# Patient Record
Sex: Female | Born: 1985 | Race: White | Hispanic: No | Marital: Married | Smoking: Never smoker
Health system: Southern US, Community
[De-identification: ages and names within clinical notes are randomized; demographics above are authoritative.]

## PROBLEM LIST (undated history)

## (undated) ENCOUNTER — Inpatient Hospital Stay: Payer: Self-pay

## (undated) ENCOUNTER — Emergency Department: Admission: EM | Payer: BC Managed Care – PPO

## (undated) DIAGNOSIS — O24419 Gestational diabetes mellitus in pregnancy, unspecified control: Secondary | ICD-10-CM

## (undated) DIAGNOSIS — B019 Varicella without complication: Secondary | ICD-10-CM

## (undated) DIAGNOSIS — O26899 Other specified pregnancy related conditions, unspecified trimester: Secondary | ICD-10-CM

## (undated) DIAGNOSIS — Z6791 Unspecified blood type, Rh negative: Secondary | ICD-10-CM

## (undated) DIAGNOSIS — E079 Disorder of thyroid, unspecified: Secondary | ICD-10-CM

## (undated) HISTORY — PX: NO PAST SURGERIES: SHX2092

## (undated) HISTORY — DX: Other specified pregnancy related conditions, unspecified trimester: O26.899

## (undated) HISTORY — DX: Varicella without complication: B01.9

## (undated) HISTORY — DX: Unspecified blood type, rh negative: Z67.91

---
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
## 2017-03-01 DIAGNOSIS — O24419 Gestational diabetes mellitus in pregnancy, unspecified control: Secondary | ICD-10-CM

## 2018-05-15 ENCOUNTER — Encounter: Payer: Self-pay | Admitting: Medical

## 2018-05-15 ENCOUNTER — Ambulatory Visit: Payer: Self-pay | Admitting: Medical

## 2018-05-15 VITALS — BP 128/89 | HR 106 | Temp 98.1°F | Resp 16 | Wt 108.2 lb

## 2018-05-15 DIAGNOSIS — H6122 Impacted cerumen, left ear: Secondary | ICD-10-CM

## 2018-05-15 NOTE — Patient Instructions (Signed)
Earwax Buildup, Adult The ears produce a substance called earwax that helps keep bacteria out of the ear and protects the skin in the ear canal. Occasionally, earwax can build up in the ear and cause discomfort or hearing loss. What increases the risk? This condition is more likely to develop in people who:  Are female.  Are elderly.  Naturally produce more earwax.  Clean their ears often with cotton swabs.  Use earplugs often.  Use in-ear headphones often.  Wear hearing aids.  Have narrow ear canals.  Have earwax that is overly thick or sticky.  Have eczema.  Are dehydrated.  Have excess hair in the ear canal.  What are the signs or symptoms? Symptoms of this condition include:  Reduced or muffled hearing.  A feeling of fullness in the ear or feeling that the ear is plugged.  Fluid coming from the ear.  Ear pain.  Ear itch.  Ringing in the ear.  Coughing.  An obvious piece of earwax that can be seen inside the ear canal.  How is this diagnosed? This condition may be diagnosed based on:  Your symptoms.  Your medical history.  An ear exam. During the exam, your health care provider will look into your ear with an instrument called an otoscope.  You may have tests, including a hearing test. How is this treated? This condition may be treated by:  Using ear drops to soften the earwax.  Having the earwax removed by a health care provider. The health care provider may: ? Flush the ear with water. ? Use an instrument that has a loop on the end (curette). ? Use a suction device.  Surgery to remove the wax buildup. This may be done in severe cases.  Follow these instructions at home:  Take over-the-counter and prescription medicines only as told by your health care provider.  Do not put any objects, including cotton swabs, into your ear. You can clean the opening of your ear canal with a washcloth or facial tissue.  Follow instructions from your health  care provider about cleaning your ears. Do not over-clean your ears.  Drink enough fluid to keep your urine clear or pale yellow. This will help to thin the earwax.  Keep all follow-up visits as told by your health care provider. If earwax builds up in your ears often or if you use hearing aids, consider seeing your health care provider for routine, preventive ear cleanings. Ask your health care provider how often you should schedule your cleanings.  If you have hearing aids, clean them according to instructions from the manufacturer and your health care provider. Contact a health care provider if:  You have ear pain.  You develop a fever.  You have blood, pus, or other fluid coming from your ear.  You have hearing loss.  You have ringing in your ears that does not go away.  Your symptoms do not improve with treatment.  You feel like the room is spinning (vertigo). Summary  Earwax can build up in the ear and cause discomfort or hearing loss.  The most common symptoms of this condition include reduced or muffled hearing and a feeling of fullness in the ear or feeling that the ear is plugged.  This condition may be diagnosed based on your symptoms, your medical history, and an ear exam.  This condition may be treated by using ear drops to soften the earwax or by having the earwax removed by a health care provider.  Do   not put any objects, including cotton swabs, into your ear. You can clean the opening of your ear canal with a washcloth or facial tissue. This information is not intended to replace advice given to you by your health care provider. Make sure you discuss any questions you have with your health care provider. Document Released: 08/12/2004 Document Revised: 09/15/2016 Document Reviewed: 09/15/2016 Elsevier Interactive Patient Education  2018 [employer]vier Inc[employer]  

## 2018-05-15 NOTE — Progress Notes (Signed)
   Subjective:    Patient ID: Candice Hoover, female    DOB: Oct 09, 1985, 32 y.o.   MRN: [id number]  HPI 32 yo female in non acute distress. Returns to clinic for ear irrigation. Patient with  13 month old baby , not breast feeding.  Review of Systems "Decreased hearing left ear due to wax"    Objective:   Physical Exam  Constitutional: She appears well-developed and well-nourished.  HENT:  Head: Normocephalic and atraumatic.  Right Ear: Hearing normal. A middle ear effusion is present.  Left Ear: Hearing normal.  Nose: Mucosal edema (right side. swelling of turbinate noted.) present.  Eyes: Pupils are equal, round, and reactive to light. Conjunctivae and EOM are normal.  Neck: Normal range of motion. Neck supple.  Skin: Skin is warm and dry.  Psychiatric: She has a normal mood and affect. Her behavior is normal.  Nursing note and vitals reviewed.   Cerumen impaction left ear s/p irrigation , moved the wax so that I could use a curette to remove the wax.  Patient very grateful. Mild edema in the left nare, turbinate , no discharge, no odor. Denies headaches.  May be allergy induced.     Assessment & Plan:  Cerumen impaction left ear./allergies.  reexplained to patient irrigation of ear with water to remove the wax.   She verbalizes understanding prior to procedure. And has no qquestions. ETD in the right ear, recommmended OTC Claritin and Flonase nasal spray per package directions, she returns from [place] Nov.15 and she will make a follow up appointment when she returns for a recheck. She verbalizes understanding and has no questions at discharge.

## 2018-05-15 NOTE — Patient Instructions (Addendum)
Earwax Buildup, Adult The ears produce a substance called earwax that helps keep bacteria out of the ear and protects the skin in the ear canal. Occasionally, earwax can build up in the ear and cause discomfort or hearing loss. What increases the risk? This condition is more likely to develop in people who:  Are female.  Are elderly.  Naturally produce more earwax.  Clean their ears often with cotton swabs.  Use earplugs often.  Use in-ear headphones often.  Wear hearing aids.  Have narrow ear canals.  Have earwax that is overly thick or sticky.  Have eczema.  Are dehydrated.  Have excess hair in the ear canal.  What are the signs or symptoms? Symptoms of this condition include:  Reduced or muffled hearing.  A feeling of fullness in the ear or feeling that the ear is plugged.  Fluid coming from the ear.  Ear pain.  Ear itch.  Ringing in the ear.  Coughing.  An obvious piece of earwax that can be seen inside the ear canal.  How is this diagnosed? This condition may be diagnosed based on:  Your symptoms.  Your medical history.  An ear exam. During the exam, your health care provider will look into your ear with an instrument called an otoscope.  You may have tests, including a hearing test. How is this treated? This condition may be treated by:  Using ear drops to soften the earwax.  Having the earwax removed by a health care provider. The health care provider may: ? Flush the ear with water. ? Use an instrument that has a loop on the end (curette). ? Use a suction device.  Surgery to remove the wax buildup. This may be done in severe cases.  Follow these instructions at home:  Take over-the-counter and prescription medicines only as told by your health care provider.  Do not put any objects, including cotton swabs, into your ear. You can clean the opening of your ear canal with a washcloth or facial tissue.  Follow instructions from your health  care provider about cleaning your ears. Do not over-clean your ears.  Drink enough fluid to keep your urine clear or pale yellow. This will help to thin the earwax.  Keep all follow-up visits as told by your health care provider. If earwax builds up in your ears often or if you use hearing aids, consider seeing your health care provider for routine, preventive ear cleanings. Ask your health care provider how often you should schedule your cleanings.  If you have hearing aids, clean them according to instructions from the manufacturer and your health care provider. Contact a health care provider if:  You have ear pain.  You develop a fever.  You have blood, pus, or other fluid coming from your ear.  You have hearing loss.  You have ringing in your ears that does not go away.  Your symptoms do not improve with treatment.  You feel like the room is spinning (vertigo). Summary  Earwax can build up in the ear and cause discomfort or hearing loss.  The most common symptoms of this condition include reduced or muffled hearing and a feeling of fullness in the ear or feeling that the ear is plugged.  This condition may be diagnosed based on your symptoms, your medical history, and an ear exam.  This condition may be treated by using ear drops to soften the earwax or by having the earwax removed by a health care provider.  Do   not put any objects, including cotton swabs, into your ear. You can clean the opening of your ear canal with a washcloth or facial tissue. This information is not intended to replace advice given to you by your health care provider. Make sure you discuss any questions you have with your health care provider. Document Released: 08/12/2004 Document Revised: 09/15/2016 Document Reviewed: 09/15/2016 Elsevier Interactive Patient Education  2018 [employer]vier Inc.  

## 2018-05-15 NOTE — Progress Notes (Signed)
   Subjective:    Patient ID: Candice Hoover, female    DOB: 04/09/1986, 32 y.o.   MRN: [id number]  HPI 32 yo female in non acute distress. Usually gets wax in Left ear. Usually needs it flushed 1 time per year. Reduced hearing with the wax currently in the left ear. No pain..Wanted it checked and she also is  flying to [place] on Oct  31st and so she wanted to be  evaluated.     Review of Systems  Constitutional: Negative for diaphoresis and fever.  HENT: Positive for hearing loss (mild). Negative for congestion, ear discharge, ear pain and sore throat.   Respiratory: Negative for cough.   Cardiovascular: Negative for leg swelling.  Gastrointestinal: Negative for abdominal pain.  Allergic/Immunologic: Negative for environmental allergies and food allergies.  Neurological: Negative for dizziness, syncope and light-headedness.  Hematological: Negative for adenopathy.  Psychiatric/Behavioral: Negative for behavioral problems, self-injury and suicidal ideas.       Objective:   Physical Exam  Constitutional: She is oriented to person, place, and time. She appears well-developed and well-nourished.  HENT:  Head: Normocephalic and atraumatic.  Right Ear: External ear normal.  Left Ear: External ear normal.  Eyes: Pupils are equal, round, and reactive to light. Conjunctivae and EOM are normal.  Neck: Normal range of motion.  Neurological: She is oriented to person, place, and time.  Skin: Skin is warm and dry.  Psychiatric: She has a normal mood and affect. Her behavior is normal. Judgment and thought content normal.  Nursing note and vitals reviewed.    Ear irrigation     Assessment & Plan:  Cerumen impaction left ear Patient left prior to procedure, I think she did not understand that we were going to irrigate her ear so she left, before nurse came in, I was in seeing another patient.   She was called and is returning to the clinic at  2:30pm today.

## 2018-06-12 ENCOUNTER — Ambulatory Visit: Payer: Self-pay | Admitting: Medical

## 2018-06-12 ENCOUNTER — Encounter: Payer: Self-pay | Admitting: Medical

## 2018-06-12 VITALS — BP 124/76 | HR 115 | Temp 99.8°F | Resp 16 | Wt 109.6 lb

## 2018-06-12 DIAGNOSIS — H6983 Other specified disorders of Eustachian tube, bilateral: Secondary | ICD-10-CM

## 2018-06-12 DIAGNOSIS — J0110 Acute frontal sinusitis, unspecified: Secondary | ICD-10-CM

## 2018-06-12 MED ORDER — AMOXICILLIN 875 MG PO TABS: 875.0000 mg | ORAL_TABLET | Freq: Two times a day (BID) | ORAL | 0 refills | Status: DC

## 2018-06-12 NOTE — Patient Instructions (Signed)

## 2018-06-12 NOTE — Progress Notes (Signed)
   Subjective:    Patient ID: Candice MainlandKateryna Streb, female    DOB: 04/23/1986, 32 y.o.   MRN: [id number]  HPI 32 yo femlae in non acute distress. After being seen for wax impaction in the left ear 05/15/2018.Marland Kitchen.  She then got sore throat and swollen lymph nodes  X 2 days seemed to resolved on its own while in [place].  Then her son started on Friday with runny nose, treated with antibiotics.   Husband seen 4 days ago and diagnosed with  Bronchitis and is now being treated with antibiotics. Saturday patient had sore throat and post nasal drip and cough productive but clear. Fever  101.0 yesterday. Headache on forehead and temples and pain between eyes.   Denies shortness of breath or chest pain.   Blood pressure 124/76, pulse (!) 115, temperature 99.8 F (37.7 C), temperature source Tympanic, resp. rate 16, weight 109 lb 9.6 oz (49.7 kg), last menstrual period 05/22/2018, SpO2 98 %.  Review of Systems  Constitutional: Positive for fatigue and fever. Negative for chills.  HENT: Positive for congestion, postnasal drip, rhinorrhea, sinus pressure, sinus pain and sore throat. Negative for ear pain, sneezing and voice change.   Eyes: Negative for discharge and itching.  Respiratory: Positive for cough. Negative for chest tightness, shortness of breath and wheezing.   Cardiovascular: Negative for chest pain.  Gastrointestinal: Negative for abdominal pain.  Endocrine: Negative for polydipsia, polyphagia and polyuria.  Genitourinary: Negative for dysuria.  Musculoskeletal: Positive for myalgias.  Skin: Negative for rash.  Allergic/Immunologic: Negative for environmental allergies and food allergies.  Neurological: Positive for headaches (headache). Negative for dizziness, syncope and light-headedness.  Hematological: Negative for adenopathy.   Using condoms for birth control protection    Objective:   Physical Exam  HENT:  Head: Normocephalic and atraumatic.  Right Ear: Hearing, external ear and  ear canal normal. A middle ear effusion is present.  Left Ear: Hearing and ear canal normal. A middle ear effusion is present.  Nose: Mucosal edema (left) and rhinorrhea present. Right sinus exhibits no frontal sinus tenderness. Left sinus exhibits frontal sinus tenderness.  Mouth/Throat: Uvula is midline and mucous membranes are normal. Uvula swelling present. Posterior oropharyngeal edema and posterior oropharyngeal erythema present. Tonsils are 2+ on the right. Tonsils are 2+ on the left.          Assessment & Plan:  Sinusitis Frontal, ETD bilaterally Meds ordered this encounter  Medications  . amoxicillin (AMOXIL) 875 MG tablet    Sig: Take 1 tablet (875 mg total) by mouth 2 (two) times daily.    Dispense:  20 tablet    Refill:  0  Rest increase fluids, alternate OTC Motrin or Tylenol take per package instructions for headache or fever.  Return in  3-5 days if not improving . Patient verbalizes understanding and has no questions at discharge.

## 2018-12-24 LAB — FETAL NONSTRESS TEST

## 2018-12-31 LAB — FETAL NONSTRESS TEST

## 2019-02-17 ENCOUNTER — Other Ambulatory Visit: Payer: Self-pay

## 2019-02-17 ENCOUNTER — Emergency Department: Payer: BC Managed Care – PPO

## 2019-02-17 ENCOUNTER — Encounter: Payer: Self-pay | Admitting: Emergency Medicine

## 2019-02-17 ENCOUNTER — Emergency Department
Admission: EM | Admit: 2019-02-17 | Discharge: 2019-02-17 | Disposition: A | Discharge status: Home / Self Care | Payer: BC Managed Care – PPO | Attending: Emergency Medicine | Admitting: Emergency Medicine

## 2019-02-17 DIAGNOSIS — R202 Paresthesia of skin: Secondary | ICD-10-CM | POA: Diagnosis not present

## 2019-02-17 DIAGNOSIS — X58XXXA Exposure to other specified factors, initial encounter: Secondary | ICD-10-CM | POA: Diagnosis not present

## 2019-02-17 DIAGNOSIS — S299XXA Unspecified injury of thorax, initial encounter: Secondary | ICD-10-CM | POA: Diagnosis present

## 2019-02-17 DIAGNOSIS — Y929 Unspecified place or not applicable: Secondary | ICD-10-CM | POA: Diagnosis not present

## 2019-02-17 DIAGNOSIS — Y999 Unspecified external cause status: Secondary | ICD-10-CM | POA: Insufficient documentation

## 2019-02-17 DIAGNOSIS — S29019A Strain of muscle and tendon of unspecified wall of thorax, initial encounter: Secondary | ICD-10-CM | POA: Diagnosis not present

## 2019-02-17 DIAGNOSIS — R439 Unspecified disturbances of smell and taste: Secondary | ICD-10-CM | POA: Diagnosis not present

## 2019-02-17 DIAGNOSIS — Y939 Activity, unspecified: Secondary | ICD-10-CM | POA: Insufficient documentation

## 2019-02-17 LAB — COMPREHENSIVE METABOLIC PANEL
ALT: 16 U/L (ref 0–44)
AST: 16 U/L (ref 15–41)
Albumin: 4.6 g/dL (ref 3.5–5.0)
Alkaline Phosphatase: 45 U/L (ref 38–126)
Anion gap: 8 (ref 5–15)
BUN: 15 mg/dL (ref 6–20)
CO2: 25 mmol/L (ref 22–32)
Calcium: 9.2 mg/dL (ref 8.9–10.3)
Chloride: 102 mmol/L (ref 98–111)
Creatinine, Ser: 0.8 mg/dL (ref 0.44–1.00)
GFR calc Af Amer: >60 mL/min (ref >60)
GFR calc non Af Amer: >60 mL/min (ref >60)
Glucose, Bld: 149 mg/dL — ABNORMAL HIGH (ref 70–99)
Potassium: 3.1 mmol/L — ABNORMAL LOW (ref 3.5–5.1)
Sodium: 135 mmol/L (ref 135–145)
Total Bilirubin: 1.1 mg/dL (ref 0.3–1.2)
Total Protein: 7.7 g/dL (ref 6.5–8.1)

## 2019-02-17 LAB — CBC WITH DIFFERENTIAL/PLATELET
Abs Immature Granulocytes: 0.02 10*3/uL (ref 0.00–0.07)
Basophils Absolute: 0.1 10*3/uL (ref 0.0–0.1)
Basophils Relative: 1 %
Eosinophils Absolute: 0.3 10*3/uL (ref 0.0–0.5)
Eosinophils Relative: 2 %
HCT: 39.2 % (ref 36.0–46.0)
Hemoglobin: 13.6 g/dL (ref 12.0–15.0)
Immature Granulocytes: 0 %
Lymphocytes Relative: 50 %
Lymphs Abs: 5.3 10*3/uL — ABNORMAL HIGH (ref 0.7–4.0)
MCH: 29.4 pg (ref 26.0–34.0)
MCHC: 34.7 g/dL (ref 30.0–36.0)
MCV: 84.7 fL (ref 80.0–100.0)
Monocytes Absolute: 0.9 10*3/uL (ref 0.1–1.0)
Monocytes Relative: 9 %
Neutro Abs: 4 10*3/uL (ref 1.7–7.7)
Neutrophils Relative %: 38 %
Platelets: 211 10*3/uL (ref 150–400)
RBC: 4.63 MIL/uL (ref 3.87–5.11)
RDW: 13.1 % (ref 11.5–15.5)
WBC: 10.6 10*3/uL — ABNORMAL HIGH (ref 4.0–10.5)
nRBC: 0 % (ref 0.0–0.2)

## 2019-02-17 MED ORDER — DIAZEPAM 5 MG PO TABS
5.0000 mg | ORAL_TABLET | Freq: Once | ORAL | Status: AC
  Administered 2019-02-17: 5 mg via ORAL
  Filled 2019-02-17: qty 1

## 2019-02-17 MED ORDER — IBUPROFEN 600 MG PO TABS: 600.0000 mg | ORAL_TABLET | Freq: Three times a day (TID) | ORAL | 0 refills | Status: DC | PRN

## 2019-02-17 MED ORDER — DIAZEPAM 5 MG PO TABS: 5.0000 mg | ORAL_TABLET | Freq: Three times a day (TID) | ORAL | 0 refills | Status: DC | PRN

## 2019-02-17 NOTE — ED Provider Notes (Signed)
[facility] Emergency Department Provider Note       Time seen: ----------------------------------------- 4:38 AM on 02/17/2019 -----------------------------------------   I have reviewed the triage vital signs and the nursing notes.  HISTORY   Chief Complaint Back Pain    HPI Candice Hoover is a 33 y.o. female with no specific past medical history who presents to the ED for left upper back pain that was intermittent.  She felt hot and tingling.  She then began having numbness in her arms and legs and feeling like she could not taste anything in her mouth.  She denies any history of this before.  Past Medical History:  Diagnosis Date  . Varicella     There are no active problems to display for this patient.   History reviewed. No pertinent surgical history.  Allergies Patient has no known allergies.  Social History Social History   Tobacco Use  . Smoking status: Never Smoker  . Smokeless tobacco: Never Used  Substance Use Topics  . Alcohol use: Not Currently    Frequency: Never  . Drug use: Never   Review of Systems Constitutional: Negative for fever. Cardiovascular: Negative for chest pain. Respiratory: Negative for shortness of breath. Gastrointestinal: Negative for abdominal pain, vomiting and diarrhea. Musculoskeletal: Positive for back pain Skin: Negative for rash. Neurological: Negative for headaches, positive for paresthesias  All systems negative/normal/unremarkable except as stated in the HPI  ____________________________________________   PHYSICAL EXAM:  VITAL SIGNS: ED Triage Vitals  Enc Vitals Group     BP 02/17/19 0110 123/81     Pulse Rate 02/17/19 0110 97     Resp 02/17/19 0110 18     Temp 02/17/19 0110 98.3 F (36.8 C)     Temp Source 02/17/19 0110 Oral     SpO2 02/17/19 0110 100 %     Weight 02/17/19 0111 105 lb (47.6 kg)     Height 02/17/19 0111 5\' 4"  (1.626 m)     Head Circumference --      Peak  Flow --      Pain Score 02/17/19 0111 4     Pain Loc --      Pain Edu? --      Excl. in [place]? --    Constitutional: Alert and oriented. Well appearing and in no distress. Eyes: Conjunctivae are normal. Normal extraocular movements. Cardiovascular: Normal rate, regular rhythm. No murmurs, rubs, or gallops. Respiratory: Normal respiratory effort without tachypnea nor retractions. Breath sounds are clear and equal bilaterally. No wheezes/rales/rhonchi. Gastrointestinal: Soft and nontender. Normal bowel sounds Musculoskeletal: Nontender with normal range of motion in extremities. No lower extremity tenderness nor edema. Neurologic:  Normal speech and language. No gross focal neurologic deficits are appreciated.  Skin:  Skin is warm, dry and intact. No rash noted. Psychiatric: Mood and affect are normal. Speech and behavior are normal.  ____________________________________________  ED COURSE:  As part of my medical decision making, I reviewed the following data within the [place] History obtained from family if available, nursing notes, old chart and ekg, as well as notes from prior ED visits. Patient presented for back pain and paresthesias, we will assess with labs and imaging as indicated at this time.   Procedures  Candice Hoover was evaluated in Emergency Department on 02/17/2019 for the symptoms described in the history of present illness. She was evaluated in the context of the global COVID-19 pandemic, which necessitated consideration that the patient might be at risk for infection with  the SARS-CoV-2 virus that causes COVID-19. Institutional protocols and algorithms that pertain to the evaluation of patients at risk for COVID-19 are in a state of rapid change based on information released by regulatory bodies including the CDC and federal and state organizations. These policies and algorithms were followed during the patient's care in the ED.   ____________________________________________   LABS (pertinent positives/negatives)  Labs Reviewed  CBC WITH DIFFERENTIAL/PLATELET - Abnormal; Notable for the following components:      Result Value   WBC 10.6 (*)    Lymphs Abs 5.3 (*)    All other components within normal limits  COMPREHENSIVE METABOLIC PANEL - Abnormal; Notable for the following components:   Potassium 3.1 (*)    Glucose, Bld 149 (*)    All other components within normal limits    RADIOLOGY  Thoracic spine x-rays were unremarkable  ____________________________________________   DIFFERENTIAL DIAGNOSIS   Muscle spasm, strain, paresthesia, panic attack  FINAL ASSESSMENT AND PLAN  Muscle spasm, paresthesia   Plan: The patient had presented for muscle spasms in her left upper back with resulting panic attack when she was worried this may be more serious. Patient's labs were reassuring. Patient's imaging did not reveal any acute process.  I will encourage anti-inflammatory and muscle relaxants.  She is cleared for outpatient follow-up.   Ulice DashJohnathan E Kendricks Reap, MD    Note: This note was generated in part or whole with voice recognition software. Voice recognition is usually quite accurate but there are transcription errors that can and very often do occur. I apologize for any typographical errors that were not detected and corrected.     Emily FilbertWilliams, Zander Ingham E, MD 02/17/19 [phone]

## 2019-02-17 NOTE — ED Triage Notes (Addendum)
Patient states that she was moving yesterday. Patient states that she developed left upper back pain that was intermittent and felt hot and tingling. Patient states that tonight she started feeling tingling everywhere.

## 2019-02-17 NOTE — ED Notes (Signed)
No peripheral IV placed this visit.   Discharge instructions reviewed with patient. Questions fielded by this RN. Patient verbalizes understanding of instructions. Patient discharged home in stable condition per williams. No acute distress noted at time of discharge.   

## 2019-03-30 ENCOUNTER — Other Ambulatory Visit: Payer: Self-pay

## 2019-03-30 ENCOUNTER — Ambulatory Visit: Payer: Self-pay | Admitting: Nurse Practitioner

## 2019-03-30 ENCOUNTER — Encounter: Payer: Self-pay | Admitting: Nurse Practitioner

## 2019-03-30 VITALS — BP 108/79 | HR 85 | Temp 98.2°F | Resp 16 | Ht 64.0 in | Wt 104.0 lb

## 2019-03-30 DIAGNOSIS — R0982 Postnasal drip: Secondary | ICD-10-CM

## 2019-03-30 DIAGNOSIS — H6123 Impacted cerumen, bilateral: Secondary | ICD-10-CM

## 2019-03-30 NOTE — Patient Instructions (Signed)
Consider Allegra to help drainage Fluids and rest encouraged Encouraged patient to call the office or primary care doctor for an appointment if no improvement in symptoms or if symptoms change or worsen after 72 hours of planned treatment. Patient verbalized understanding of all instructions given/reviewed and has no further questions or concerns at this time.

## 2019-03-30 NOTE — Progress Notes (Signed)
   Subjective:    Patient ID: Candice Hoover, female    DOB: Jun 11, 1986, 33 y.o.   MRN: [id number]  HPI Lateefah is here today with c/o PND and sore throat x 2 weeks. She reports she continues to get sick when her son is in daycare and they have just returned. She denies anyone currently in the home with similar symptoms. She reports her throat discomfort waxes and wanes. She hasnt taken any medicatioin but has used hot tea with honey and lemon. She denies any fever, wheezing, SOB, facial pressure or pain, ear pain.    Review of Systems  Constitutional: Negative for chills and fever.  HENT: Positive for sore throat. Negative for congestion, sinus pressure and sinus pain.   Respiratory: Negative for cough, shortness of breath and wheezing.   Cardiovascular: Negative for chest pain.  Skin: Negative for color change and rash.       Objective:   Physical Exam Vitals signs reviewed.  Constitutional:      Appearance: She is well-developed. She is not ill-appearing.  HENT:     Head: Normocephalic and atraumatic.     Comments: No maxillary or frontal sinus tenderness    Nose: No congestion.     Mouth/Throat:     Mouth: Mucous membranes are moist.     Pharynx: No pharyngeal swelling or oropharyngeal exudate.     Tonsils: No tonsillar exudate.     Comments: Mildly injected pharnyx with cobblestone. Unable to visualize TM d/t bilateral cerumen impaction. Some success with ear lavage/currette but still difficult to examine TM. Neck:     Musculoskeletal: Normal range of motion.  Cardiovascular:     Rate and Rhythm: Normal rate and regular rhythm.     Heart sounds: Normal heart sounds.  Pulmonary:     Effort: Pulmonary effort is normal. No respiratory distress.     Breath sounds: Normal breath sounds.  Abdominal:     General: Bowel sounds are normal.     Palpations: Abdomen is soft.  Musculoskeletal: Normal range of motion.  Skin:    General: Skin is warm and dry.  Neurological:   Mental Status: She is alert and oriented to person, place, and time.  Psychiatric:        Mood and Affect: Mood normal.           Assessment & Plan:

## 2019-06-19 ENCOUNTER — Ambulatory Visit (INDEPENDENT_AMBULATORY_CARE_PROVIDER_SITE_OTHER): Payer: BC Managed Care – PPO | Admitting: Obstetrics and Gynecology

## 2019-06-19 ENCOUNTER — Encounter: Payer: Self-pay | Admitting: Obstetrics and Gynecology

## 2019-06-19 ENCOUNTER — Other Ambulatory Visit: Payer: Self-pay

## 2019-06-19 VITALS — BP 106/68 | Wt 112.0 lb

## 2019-06-19 DIAGNOSIS — O09291 Supervision of pregnancy with other poor reproductive or obstetric history, first trimester: Secondary | ICD-10-CM

## 2019-06-19 DIAGNOSIS — Z6791 Unspecified blood type, Rh negative: Secondary | ICD-10-CM | POA: Insufficient documentation

## 2019-06-19 DIAGNOSIS — Z124 Encounter for screening for malignant neoplasm of cervix: Secondary | ICD-10-CM

## 2019-06-19 DIAGNOSIS — Z8632 Personal history of gestational diabetes: Secondary | ICD-10-CM

## 2019-06-19 DIAGNOSIS — O0990 Supervision of high risk pregnancy, unspecified, unspecified trimester: Secondary | ICD-10-CM | POA: Insufficient documentation

## 2019-06-19 DIAGNOSIS — Z3A10 10 weeks gestation of pregnancy: Secondary | ICD-10-CM

## 2019-06-19 DIAGNOSIS — O26891 Other specified pregnancy related conditions, first trimester: Secondary | ICD-10-CM

## 2019-06-19 DIAGNOSIS — Z113 Encounter for screening for infections with a predominantly sexual mode of transmission: Secondary | ICD-10-CM

## 2019-06-19 DIAGNOSIS — O0991 Supervision of high risk pregnancy, unspecified, first trimester: Secondary | ICD-10-CM

## 2019-06-19 DIAGNOSIS — Z3201 Encounter for pregnancy test, result positive: Secondary | ICD-10-CM | POA: Diagnosis not present

## 2019-06-19 DIAGNOSIS — Z1379 Encounter for other screening for genetic and chromosomal anomalies: Secondary | ICD-10-CM

## 2019-06-19 LAB — POCT URINE PREGNANCY: Preg Test, Ur: POSITIVE — AB

## 2019-06-19 NOTE — Progress Notes (Signed)
New Obstetric Patient H&P   Chief Complaint: "Desires prenatal care"   History of Present Illness: Patient is a 33 y.o. G3P1011 Not Hispanic or Latino female, LMP 04/06/2019 presents with amenorrhea and positive home pregnancy test. Based on her  LMP, her EDD is Estimated Date of Delivery: 01/11/20 and her EGA is [redacted]w[redacted]d. Cycles are somewhat irregular.  Her cycles can be between 28-36 days. Her last pap smear was nearly 2 years ago and was no abnormalities.    She had a urine pregnancy test which was positive 6 or 7 week(s)  ago. Her last menstrual period was normal and lasted for  4 day(s). Since her LMP she claims she has experienced no issues. She denies vaginal bleeding. Her past medical history is noncontributory. Her prior pregnancies are notable for gestational diabetes (diet controlled).  Her first baby weight 7 lb 12 oz.   Since her LMP, she admits to the use of tobacco products  no She claims she has lost 3 pounds since the start of her pregnancy.  There are cats in the home in the home  no  She admits close contact with children on a regular basis  yes  She has had chicken pox in the past yes She has had Tuberculosis exposures, symptoms, or previously tested positive for TB   no Current or past history of domestic violence. no  Genetic Screening/Teratology Counseling: (Includes patient, baby's father, or anyone in either family with:)   67. Patient's age >/= 44 [facility]  no 2. Thalassemia ([place], [place], [place], or Asian background): MCV<80  no 3. Neural tube defect (meningomyelocele, spina bifida, anencephaly)  no 4. Congenital heart defect  no  5. Down syndrome  no 6. Tay-Sachs (Jewish, [place])  no 7. Canavan's Disease  no 8. Sickle cell disease or trait (African)  no  9. Hemophilia or other blood disorders  no  10. Muscular dystrophy  no  11. Cystic fibrosis  no  12. Huntington's Chorea  no  13. Mental retardation/autism  no 14. Other inherited genetic or  chromosomal disorder  no 15. Maternal metabolic disorder (DM, PKU, etc)  no 16. Patient or FOB with a child with a birth defect not listed above no  16a. Patient or FOB with a birth defect themselves no 17. Recurrent pregnancy loss, or stillbirth  no  18. Any medications since LMP other than prenatal vitamins (include vitamins, supplements, OTC meds, drugs, alcohol)  None.  19. Any other genetic/environmental exposure to discuss  no  Infection History:   1. Lives with someone with TB or TB exposed  no  2. Patient or partner has history of genital herpes  no 3. Rash or viral illness since LMP  no 4. History of STI (GC, CT, HPV, syphilis, HIV)  no 5. History of recent travel :  no  Other pertinent information:  nausea  Review of Systems:10 point review of systems negative unless otherwise noted in HPI  Past Medical History:  Diagnosis Date  . Gestational diabetes   . Rh negative state in antepartum period   . Varicella    Past Surgical History:  Procedure Laterality Date  . NO PAST SURGERIES     Gynecologic History: Patient's last menstrual period was 04/06/2019.  Obstetric History: G3P1011, s/p SVD  Family History  Problem Relation Age of Onset  . Thyroid disease Sister     Social History   Socioeconomic History  . Marital status: Married    Spouse name: Not on file  .  Number of children: Not on file  . Years of education: Not on file  . Highest education level: Not on file  Occupational History  . Not on file  Social Needs  . Financial resource strain: Not on file  . Food insecurity    Worry: Not on file    Inability: Not on file  . Transportation needs    Medical: Not on file    Non-medical: Not on file  Tobacco Use  . Smoking status: Never Smoker  . Smokeless tobacco: Never Used  Substance and Sexual Activity  . Alcohol use: Not Currently    Frequency: Never  . Drug use: Never  . Sexual activity: Yes    Birth control/protection: None  Lifestyle  .  Physical activity    Days per week: Not on file    Minutes per session: Not on file  . Stress: Not on file  Relationships  . Social Musician on phone: Not on file    Gets together: Not on file    Attends religious service: Not on file    Active member of club or organization: Not on file    Attends meetings of clubs or organizations: Not on file    Relationship status: Not on file  . Intimate partner violence    Fear of current or ex partner: Not on file    Emotionally abused: Not on file    Physically abused: Not on file    Forced sexual activity: Not on file  Other Topics Concern  . Not on file  Social History Narrative  . Not on file   Allergies: No Known Allergies  Prior to Admission medications   Medication Sig Start Date End Date Taking? Authorizing Provider  Prenatal Vit-Fe Fumarate-FA (PRENATAL VITAMIN PO) Take by mouth.   Yes [provider]   Physical Exam BP 106/68   Wt 112 lb (50.8 kg)   LMP 04/06/2019   BMI 19.22 kg/m   Physical Exam Vitals signs reviewed. Exam conducted with a chaperone present.  Constitutional:      General: She is not in acute distress.    Appearance: Normal appearance.  HENT:     Head: Normocephalic and atraumatic.  Eyes:     General: No scleral icterus.    Conjunctiva/sclera: Conjunctivae normal.  Cardiovascular:     Rate and Rhythm: Normal rate and regular rhythm.     Heart sounds: No murmur. No friction rub. No gallop.   Pulmonary:     Effort: No respiratory distress.     Breath sounds: Normal breath sounds. No wheezing, rhonchi or rales.  Abdominal:     General: Bowel sounds are normal. There is no distension.     Palpations: Abdomen is soft. There is no mass.     Tenderness: There is no abdominal tenderness. There is no guarding or rebound.     Hernia: There is no hernia in the left inguinal area or right inguinal area.  Genitourinary:    General: Normal vulva.     Exam position: Lithotomy position.      Tanner stage (genital): 5.     Labia:        Right: No rash, tenderness, lesion or injury.        Left: No rash, tenderness, lesion or injury.      Vagina: Normal.     Cervix: Normal.     Uterus: Normal.      Adnexa: Right adnexa normal and left adnexa  normal.  Musculoskeletal: Normal range of motion.        General: No swelling.  Lymphadenopathy:     Lower Body: No right inguinal adenopathy. No left inguinal adenopathy.  Skin:    General: Skin is warm and dry.  Neurological:     General: No focal deficit present.     Mental Status: She is oriented to person, place, and time.     Cranial Nerves: No cranial nerve deficit.  Psychiatric:        Mood and Affect: Mood normal.        Behavior: Behavior normal.        Judgment: Judgment normal.    Female Chaperone present during breast and/or pelvic exam.  Assessment: 33 y.o. G3P1011 at 957w4d presenting to initiate prenatal care  Plan: 1) Avoid alcoholic beverages. 2) Patient encouraged not to smoke.  3) Discontinue the use of all non-medicinal drugs and chemicals.  4) Take prenatal vitamins daily.  5) Nutrition, food safety (fish, cheese advisories, and high nitrite foods) and exercise discussed. 6) Hospital and practice style discussed with cross coverage system.  7) Genetic Screening, such as with 1st Trimester Screening, cell free fetal DNA, AFP testing, and Ultrasound, as well as with amniocentesis and CVS as appropriate, is discussed with patient. At the conclusion of today's visit patient requested genetic testing 8) Patient is asked about travel to areas at risk for the Zika virus, and counseled to avoid travel and exposure to mosquitoes or sexual partners who may have themselves been exposed to the virus. Testing is discussed, and will be ordered as appropriate.  9) Flu vaccine today 10) Carrier screening today  Thomasene MohairStephen Faruq Rosenberger, MD 06/19/2019 10:55 AM

## 2019-06-19 NOTE — Addendum Note (Signed)
Addended by: Brien Few on: 06/19/2019 03:19 PM   Modules accepted: Orders

## 2019-06-20 LAB — URINE DRUG PANEL 7
Amphetamines, Urine: NEGATIVE ng/mL
Barbiturate Quant, Ur: NEGATIVE ng/mL
Benzodiazepine Quant, Ur: NEGATIVE ng/mL
Cannabinoid Quant, Ur: NEGATIVE ng/mL
Cocaine (Metab.): NEGATIVE ng/mL
Opiate Quant, Ur: NEGATIVE ng/mL
PCP Quant, Ur: NEGATIVE ng/mL

## 2019-06-21 LAB — HEMOGLOBINOPATHY EVALUATION
HGB C: 0 %
HGB S: 0 %
HGB VARIANT: 0 %
Hemoglobin A2 Quantitation: 2 % (ref 1.8–3.2)
Hemoglobin F Quantitation: 0 % (ref 0.0–2.0)
Hgb A: 98 % (ref 96.4–98.8)

## 2019-06-21 LAB — CYTOLOGY - PAP
Chlamydia: NEGATIVE
Comment: NEGATIVE
Comment: NEGATIVE
Comment: NORMAL
Diagnosis: NEGATIVE
High risk HPV: NEGATIVE
Neisseria Gonorrhea: NEGATIVE

## 2019-06-21 LAB — RPR+RH+ABO+RUB AB+AB SCR+CB...
HIV Screen 4th Generation wRfx: NONREACTIVE
Hematocrit: 41.9 % (ref 34.0–46.6)
Hemoglobin: 14.2 g/dL (ref 11.1–15.9)
Hepatitis B Surface Ag: NEGATIVE
MCH: 29.5 pg (ref 26.6–33.0)
MCHC: 33.9 g/dL (ref 31.5–35.7)
MCV: 87 fL (ref 79–97)
Platelets: 297 10*3/uL (ref 150–450)
RBC: 4.82 x10E6/uL (ref 3.77–5.28)
RDW: 13.2 % (ref 11.7–15.4)
RPR Ser Ql: NONREACTIVE
Rh Factor: NEGATIVE
Rubella Antibodies, IGG: 8.78 index (ref >0.99)
Varicella zoster IgG: 1192 index (ref >165)
WBC: 17.2 10*3/uL — ABNORMAL HIGH (ref 3.4–10.8)

## 2019-06-21 LAB — URINE CULTURE: Organism ID, Bacteria: NO GROWTH

## 2019-06-21 LAB — AB SCR+ANTIBODY ID: Antibody Screen: POSITIVE — AB

## 2019-06-24 LAB — MATERNIT21 PLUS CORE+SCA
Fetal Fraction: 7
Monosomy X (Turner Syndrome): NOT DETECTED
Result (T21): NEGATIVE
Trisomy 13 (Patau syndrome): NEGATIVE
Trisomy 18 (Edwards syndrome): NEGATIVE
Trisomy 21 (Down syndrome): NEGATIVE
XXX (Triple X Syndrome): NOT DETECTED
XXY (Klinefelter Syndrome): NOT DETECTED
XYY (Jacobs Syndrome): NOT DETECTED

## 2019-06-29 LAB — INHERITEST CORE(CF97,SMA,FRAX)

## 2019-07-05 ENCOUNTER — Other Ambulatory Visit: Payer: BC Managed Care – PPO

## 2019-07-05 ENCOUNTER — Encounter: Payer: Self-pay | Admitting: Obstetrics and Gynecology

## 2019-07-05 ENCOUNTER — Ambulatory Visit (INDEPENDENT_AMBULATORY_CARE_PROVIDER_SITE_OTHER): Payer: BC Managed Care – PPO | Admitting: Obstetrics and Gynecology

## 2019-07-05 ENCOUNTER — Other Ambulatory Visit: Payer: Self-pay

## 2019-07-05 VITALS — BP 112/70 | Wt 111.0 lb

## 2019-07-05 DIAGNOSIS — Z3A12 12 weeks gestation of pregnancy: Secondary | ICD-10-CM

## 2019-07-05 DIAGNOSIS — O26891 Other specified pregnancy related conditions, first trimester: Secondary | ICD-10-CM

## 2019-07-05 DIAGNOSIS — Z8632 Personal history of gestational diabetes: Secondary | ICD-10-CM

## 2019-07-05 DIAGNOSIS — O09291 Supervision of pregnancy with other poor reproductive or obstetric history, first trimester: Secondary | ICD-10-CM

## 2019-07-05 DIAGNOSIS — O0990 Supervision of high risk pregnancy, unspecified, unspecified trimester: Secondary | ICD-10-CM

## 2019-07-05 DIAGNOSIS — Z6791 Unspecified blood type, Rh negative: Secondary | ICD-10-CM

## 2019-07-05 NOTE — Progress Notes (Signed)
Routine Prenatal Care Visit  Subjective  Candice Hoover is a 33 y.o. G3P1011 at [redacted]w[redacted]d being seen today for ongoing prenatal care.  She is currently monitored for the following issues for this low-risk pregnancy and has Supervision of high risk pregnancy, antepartum; History of gestational diabetes; and Rh negative state in antepartum period on their problem list.  ----------------------------------------------------------------------------------- Patient reports no complaints.    . Vag. Bleeding: None.   . Leaking Fluid denies.  ----------------------------------------------------------------------------------- The following portions of the patient's history were reviewed and updated as appropriate: allergies, current medications, past family history, past medical history, past social history, past surgical history and problem list. Problem list updated.  Objective  Blood pressure 112/70, weight 111 lb (50.3 kg), last menstrual period 04/06/2019. Pregravid weight 115 lb (52.2 kg) Total Weight Gain -4 lb (-1.814 kg) Urinalysis: Urine Protein    Urine Glucose    Fetal Status: Fetal Heart Rate (bpm): 150         General:  Alert, oriented and cooperative. Patient is in no acute distress.  Skin: Skin is warm and dry. No rash noted.   Cardiovascular: Normal heart rate noted  Respiratory: Normal respiratory effort, no problems with respiration noted  Abdomen: Soft, gravid, appropriate for gestational age.       Pelvic:  Cervical exam deferred        Extremities: Normal range of motion.     Mental Status: Normal mood and affect. Normal behavior. Normal judgment and thought content.   Assessment   33 y.o. G3P1011 at [redacted]w[redacted]d by  01/11/2020, by Last Menstrual Period presenting for routine prenatal visit  Plan   pregnancy Problems (from 06/19/19 to present)    Problem Noted Resolved   Supervision of high risk pregnancy, antepartum 06/19/2019 by Will Bonnet, MD No   Overview Signed  06/19/2019 10:57 AM by Will Bonnet, MD    [facility]inic [facility] Prenatal Labs  Dating  Blood type:     Genetic Screen 1 Screen:    AFP:     Quad:     NIPS: Antibody:   Anatomic [place]  Rubella:   Varicella: @VZVIGG @  GTT Early:               Third trimester:  RPR:     Rhogam  HBsAg:     TDaP vaccine                       Flu Shot: HIV:     Baby Food                                GBS:   Contraception  Pap:  CBB     CS/VBAC    Support Person            History of gestational diabetes 06/19/2019 by Will Bonnet, MD No   Overview Signed 06/19/2019 10:57 AM by Will Bonnet, MD    [ ]  early 1h gtt      Rh negative state in antepartum period 06/19/2019 by Will Bonnet, MD No   Overview Signed 06/19/2019 10:58 AM by Will Bonnet, MD    [ ]  rhogam per protocol          Preterm labor symptoms and general obstetric precautions including but not limited to vaginal bleeding, contractions, leaking of fluid and fetal movement were reviewed in detail with the patient. Please refer to  After Visit Summary for other counseling recommendations.   - 1h gtt today  Return in about 4 weeks (around 08/02/2019) for Routine Prenatal Appointment.  Thomasene Mohair, MD, [facility], [facility] 07/05/2019 3:01 PM

## 2019-07-06 LAB — GLUCOSE, 1 HOUR GESTATIONAL: Gestational Diabetes Screen: 112 mg/dL (ref 65–139)

## 2019-07-20 NOTE — L&D Delivery Note (Signed)
Obstetrical Delivery Note   Date of Delivery:   01/07/2020 Primary OB:   [facility] Gestational Age/EDD: [redacted]w[redacted]d (Dated by LMP) Antepartum complications: concerns for possible intrahepatic cholestasis  Delivered By:   Farrel Conners, CNM  Delivery Type:   spontaneous vaginal delivery  Procedure Details:   CTSP with urge to push and rapidly progressing labor. Mother pushed to deliver a viable female infant in LOA. Nuchal cord x 1 reduced over shoulder with delivery. Arm cord reduced after birth. Meconium stained amniotic fluid noted after delivery of baby. Baby was initially with poor tone and not crying. She was placed on mother's abdomen and began crying with the tactile stimulation. After a delayed cord clamping the cord was cut by the father of the baby. Baby then placed skin to skin with mother. Spontaneous delivery of intact placenta and 3 vessel cord. Pitocin IV and fundal massage resulted in firm fundus. Small periurethral abrasion was not bleeding and wound edges approximated well-repair not indicated. Minimal blood loss with delivery. Anesthesia:    none Intrapartum complications: MSAF, nuchal and arm cords GBS:    negative Laceration:    Periurethral, not needing repair Episiotomy:    none Placenta:    Via active 3rd stage. To pathology: no Estimated Blood Loss:  100 ml Baby:    Liveborn female, Apgars 7/8, weight pending    Farrel Conners, CNM

## 2019-08-02 ENCOUNTER — Other Ambulatory Visit: Payer: Self-pay

## 2019-08-02 ENCOUNTER — Ambulatory Visit (INDEPENDENT_AMBULATORY_CARE_PROVIDER_SITE_OTHER): Payer: BC Managed Care – PPO | Admitting: Obstetrics and Gynecology

## 2019-08-02 ENCOUNTER — Encounter: Payer: Self-pay | Admitting: Obstetrics and Gynecology

## 2019-08-02 VITALS — BP 110/64 | Wt 113.0 lb

## 2019-08-02 DIAGNOSIS — Z6791 Unspecified blood type, Rh negative: Secondary | ICD-10-CM

## 2019-08-02 DIAGNOSIS — O09292 Supervision of pregnancy with other poor reproductive or obstetric history, second trimester: Secondary | ICD-10-CM

## 2019-08-02 DIAGNOSIS — Z8632 Personal history of gestational diabetes: Secondary | ICD-10-CM

## 2019-08-02 DIAGNOSIS — O26899 Other specified pregnancy related conditions, unspecified trimester: Secondary | ICD-10-CM

## 2019-08-02 DIAGNOSIS — Z3A16 16 weeks gestation of pregnancy: Secondary | ICD-10-CM

## 2019-08-02 DIAGNOSIS — O26892 Other specified pregnancy related conditions, second trimester: Secondary | ICD-10-CM

## 2019-08-02 DIAGNOSIS — O0990 Supervision of high risk pregnancy, unspecified, unspecified trimester: Secondary | ICD-10-CM

## 2019-08-02 NOTE — Progress Notes (Signed)
Routine Prenatal Care Visit  Subjective  Candice Hoover is a 34 y.o. G3P1011 at [redacted]w[redacted]d being seen today for ongoing prenatal care.  She is currently monitored for the following issues for this low-risk pregnancy and has Supervision of high risk pregnancy, antepartum; History of gestational diabetes; and Rh negative state in antepartum period on their problem list.  ----------------------------------------------------------------------------------- Patient reports no complaints.    . Vag. Bleeding: None.  Movement: Absent. Leaking Fluid denies.  ----------------------------------------------------------------------------------- The following portions of the patient's history were reviewed and updated as appropriate: allergies, current medications, past family history, past medical history, past social history, past surgical history and problem list. Problem list updated.  Objective  Blood pressure 110/64, weight 113 lb (51.3 kg), last menstrual period 04/06/2019. Pregravid weight 115 lb (52.2 kg) Total Weight Gain -2 lb (-0.907 kg) Urinalysis: Urine Protein    Urine Glucose    Fetal Status: Fetal Heart Rate (bpm): 145   Movement: Absent     General:  Alert, oriented and cooperative. Patient is in no acute distress.  Skin: Skin is warm and dry. No rash noted.   Cardiovascular: Normal heart rate noted  Respiratory: Normal respiratory effort, no problems with respiration noted  Abdomen: Soft, gravid, appropriate for gestational age. Pain/Pressure: Absent     Pelvic:  Cervical exam deferred        Extremities: Normal range of motion.     Mental Status: Normal mood and affect. Normal behavior. Normal judgment and thought content.   Assessment   34 y.o. G3P1011 at [redacted]w[redacted]d by  01/11/2020, by Last Menstrual Period presenting for routine prenatal visit  Plan   pregnancy Problems (from 06/19/19 to present)    Problem Noted Resolved   Supervision of high risk pregnancy, antepartum 06/19/2019  by Conard Novak, MD No   Overview Signed 06/19/2019 10:57 AM by Conard Novak, MD    [facility] Prenatal Labs  Dating  Blood type:     Genetic Screen 1 Screen:    AFP:     Quad:     NIPS: Antibody:   Anatomic [place]  Rubella:   Varicella: @VZVIGG @  GTT Early:               Third trimester:  RPR:     Rhogam  HBsAg:     TDaP vaccine                       Flu Shot: HIV:     Baby Food                                GBS:   Contraception  Pap:  CBB     CS/VBAC    Support Person            History of gestational diabetes 06/19/2019 by 14/07/2018, MD No   Overview Signed 06/19/2019 10:57 AM by 14/07/2018, MD    [ ]  early 1h gtt      Rh negative state in antepartum period 06/19/2019 by , MD No   Overview Signed 06/19/2019 10:58 AM by Conard Novak, MD    [ ]  rhogam per protocol          Preterm labor symptoms and general obstetric precautions including but not limited to vaginal bleeding, contractions, leaking of fluid and fetal movement were reviewed in detail with the patient. Please refer to  After Visit Summary for other counseling recommendations.   -msAFP today  Return in about 2 weeks (around 08/16/2019) for Anatomy u/s and routine prenatal.  Prentice Docker, MD, [place], [facility] 08/02/2019 2:54 PM

## 2019-08-04 LAB — AFP, SERUM, OPEN SPINA BIFIDA
AFP MoM: 1.13
AFP Value: 51.6 ng/mL
Gest. Age on Collection Date: 16.9 weeks
Maternal Age At EDD: 34 yr
OSBR Risk 1 IN: 8106
Test Results:: NEGATIVE
Weight: 113 [lb_av]

## 2019-08-16 ENCOUNTER — Encounter: Payer: BC Managed Care – PPO | Admitting: Obstetrics and Gynecology

## 2019-08-16 ENCOUNTER — Other Ambulatory Visit: Payer: BC Managed Care – PPO

## 2019-08-17 ENCOUNTER — Other Ambulatory Visit: Payer: Self-pay

## 2019-08-17 ENCOUNTER — Encounter: Payer: Self-pay | Admitting: Obstetrics and Gynecology

## 2019-08-17 ENCOUNTER — Ambulatory Visit (INDEPENDENT_AMBULATORY_CARE_PROVIDER_SITE_OTHER): Payer: BC Managed Care – PPO | Admitting: Obstetrics and Gynecology

## 2019-08-17 ENCOUNTER — Ambulatory Visit (INDEPENDENT_AMBULATORY_CARE_PROVIDER_SITE_OTHER): Payer: BC Managed Care – PPO

## 2019-08-17 VITALS — BP 110/60 | Wt 115.0 lb

## 2019-08-17 DIAGNOSIS — Z3A19 19 weeks gestation of pregnancy: Secondary | ICD-10-CM | POA: Diagnosis not present

## 2019-08-17 DIAGNOSIS — O0990 Supervision of high risk pregnancy, unspecified, unspecified trimester: Secondary | ICD-10-CM

## 2019-08-17 DIAGNOSIS — O09292 Supervision of pregnancy with other poor reproductive or obstetric history, second trimester: Secondary | ICD-10-CM

## 2019-08-17 DIAGNOSIS — Z8632 Personal history of gestational diabetes: Secondary | ICD-10-CM

## 2019-08-17 DIAGNOSIS — O26899 Other specified pregnancy related conditions, unspecified trimester: Secondary | ICD-10-CM

## 2019-08-17 DIAGNOSIS — Z363 Encounter for antenatal screening for malformations: Secondary | ICD-10-CM

## 2019-08-17 DIAGNOSIS — Z6791 Unspecified blood type, Rh negative: Secondary | ICD-10-CM

## 2019-08-17 NOTE — Progress Notes (Signed)
Routine Prenatal Care Visit  Subjective  Candice Hoover is a 34 y.o. G3P1011 at [redacted]w[redacted]d being seen today for ongoing prenatal care.  She is currently monitored for the following issues for this low-risk pregnancy and has Supervision of high risk pregnancy, antepartum; History of gestational diabetes; and Rh negative state in antepartum period on their problem list.  ----------------------------------------------------------------------------------- Patient reports no complaints.    . Vag. Bleeding: None.  Movement: Absent. Leaking Fluid denies.  Anatomy u/s incomplete today, otherwise normal. ----------------------------------------------------------------------------------- The following portions of the patient's history were reviewed and updated as appropriate: allergies, current medications, past family history, past medical history, past social history, past surgical history and problem list. Problem list updated.  Objective  Blood pressure 110/60, weight 115 lb (52.2 kg), last menstrual period 04/06/2019. Pregravid weight 115 lb (52.2 kg) Total Weight Gain 0 lb (0 kg) Urinalysis: Urine Protein    Urine Glucose    Fetal Status: Fetal Heart Rate (bpm): 138   Movement: Absent     General:  Alert, oriented and cooperative. Patient is in no acute distress.  Skin: Skin is warm and dry. No rash noted.   Cardiovascular: Normal heart rate noted  Respiratory: Normal respiratory effort, no problems with respiration noted  Abdomen: Soft, gravid, appropriate for gestational age. Pain/Pressure: Absent     Pelvic:  Cervical exam deferred        Extremities: Normal range of motion.     Mental Status: Normal mood and affect. Normal behavior. Normal judgment and thought content.   Imaging Results Anatomy U/S  Result Date: 08/17/2019 Patient Name: Candice Hoover DOB: 1985-12-04 MRN: [id number] ULTRASOUND REPORT Location: [facility] Date of Service: 08/17/2019 Indications:Anatomy Ultrasound  Findings: Nelda Marseille intrauterine pregnancy is visualized with FHR at 138 BPM. Biometrics give an (U/S) Gestational age of [redacted]w[redacted]d and an (U/S) EDD of 01/07/2020; this correlates with the clinically established Estimated Date of Delivery: 01/11/2020 Fetal presentation is Cephalic. EFW: 293 g (10 oz). Placenta: anterior. Grade: 1 AFI: subjectively normal. Anatomic survey is incomplete for RVOT, LVOT, and 4 chamber view and normal; Gender - female.  Impression: 1. [redacted]w[redacted]d Viable Singleton Intrauterine pregnancy by U/S. 2. (U/S) EDD is consistent with Clinically established Estimated Date of Delivery: 01/11/20 . 3. Normal Anatomy Scan, incomplete cardiac views Recommendations: 1.Clinical correlation with the patient's History and Physical Exam. 2. Follow up ultrasound for cardiac views. Gweneth Dimitri, RT There is a singleton gestation with subjectively normal amniotic fluid volume. The fetal biometry correlates with established dating. Detailed evaluation of the fetal anatomy was performed.The fetal anatomical survey appears within normal limits within the resolution of ultrasound as described above.  Not all structures were able to be visualized on today's study.  It is recommended that a follow-up ultrasound be performed to complete visualization of the unobserved structures today.  It must be noted that a normal ultrasound is unable to rule out fetal aneuploidy nor is it able to detect all possible malformations.    The ultrasound images and findings were reviewed by me and I agree with the above report. Prentice Docker, MD, [facility], [facility] 08/17/2019 11:34 AM       Assessment   34 y.o. G3P1011 at [redacted]w[redacted]d by  01/11/2020, by Last Menstrual Period presenting for routine prenatal visit  Plan   pregnancy Problems (from 06/19/19 to present)    Problem Noted Resolved   Supervision of high risk pregnancy, antepartum 06/19/2019 by Will Bonnet, MD No   Overview Addendum  08/17/2019   1:12 PM by Conard Novak, MD    [facility] Prenatal Labs  Dating L=6 Blood type: B/Negative/-- (12/01 1227)   Genetic Screen NIPS: diploid XX Antibody:Positive, See Final Results (12/01 1227)  Anatomic [place] Incomplete 08/17/19 Rubella: 8.78 (12/01 1227)  Varicella: Immune  GTT Early: 112       Third trimester:  RPR: Non Reactive (12/01 1227)   Rhogam Per protocol HBsAg: Negative (12/01 1227)   TDaP vaccine  [ ]  28-32 weeks                      Flu Shot: 06/19/2019 HIV: Non Reactive (12/01 1227)   Baby Food                                GBS:   Contraception  Pap:  CBB     CS/VBAC    Support Person Hubby        History of gestational diabetes 06/19/2019 by 14/07/2018, MD No   Overview Addendum 08/17/2019  1:12 PM by 08/19/2019, MD    [x]  early 1h gtt = 112      Rh negative state in antepartum period 06/19/2019 by , MD No   Overview Signed 06/19/2019 10:58 AM by Conard Novak, MD    [ ]  rhogam per protocol          Preterm labor symptoms and general obstetric precautions including but not limited to vaginal bleeding, contractions, leaking of fluid and fetal movement were reviewed in detail with the patient. Please refer to After Visit Summary for other counseling recommendations.   Return in about 2 weeks (around 08/31/2019) for U/S for completion anatomy and routine prenatal.  Conard Novak, MD, OB/GYN, [facility] 08/17/2019 1:10 PM

## 2019-09-03 ENCOUNTER — Ambulatory Visit (INDEPENDENT_AMBULATORY_CARE_PROVIDER_SITE_OTHER): Payer: BC Managed Care – PPO

## 2019-09-03 ENCOUNTER — Ambulatory Visit (INDEPENDENT_AMBULATORY_CARE_PROVIDER_SITE_OTHER): Payer: BC Managed Care – PPO | Admitting: Obstetrics and Gynecology

## 2019-09-03 ENCOUNTER — Other Ambulatory Visit: Payer: Self-pay

## 2019-09-03 VITALS — BP 112/52 | Wt 123.0 lb

## 2019-09-03 DIAGNOSIS — Z6791 Unspecified blood type, Rh negative: Secondary | ICD-10-CM

## 2019-09-03 DIAGNOSIS — Z362 Encounter for other antenatal screening follow-up: Secondary | ICD-10-CM

## 2019-09-03 DIAGNOSIS — Z3A21 21 weeks gestation of pregnancy: Secondary | ICD-10-CM | POA: Diagnosis not present

## 2019-09-03 DIAGNOSIS — O321XX0 Maternal care for breech presentation, not applicable or unspecified: Secondary | ICD-10-CM | POA: Diagnosis not present

## 2019-09-03 DIAGNOSIS — Z8632 Personal history of gestational diabetes: Secondary | ICD-10-CM

## 2019-09-03 DIAGNOSIS — O0990 Supervision of high risk pregnancy, unspecified, unspecified trimester: Secondary | ICD-10-CM

## 2019-09-03 DIAGNOSIS — O26899 Other specified pregnancy related conditions, unspecified trimester: Secondary | ICD-10-CM

## 2019-09-03 LAB — POCT URINALYSIS DIPSTICK OB
Glucose, UA: NEGATIVE
POC,PROTEIN,UA: NEGATIVE

## 2019-09-03 NOTE — Progress Notes (Signed)
ROB  °Anatomy scan °

## 2019-09-03 NOTE — Progress Notes (Signed)
Routine Prenatal Care Visit  Subjective  Candice Hoover is a 34 y.o. G3P1011 at [redacted]w[redacted]d being seen today for ongoing prenatal care.  She is currently monitored for the following issues for this low-risk pregnancy and has Supervision of high risk pregnancy, antepartum; History of gestational diabetes; and Rh negative state in antepartum period on their problem list.  ----------------------------------------------------------------------------------- Patient reports no complaints.   Contractions: Not present. Vag. Bleeding: None.  Movement: Present. Denies leaking of fluid.  ----------------------------------------------------------------------------------- The following portions of the patient's history were reviewed and updated as appropriate: allergies, current medications, past family history, past medical history, past social history, past surgical history and problem list. Problem list updated.   Objective  Blood pressure (!) 112/52, weight 123 lb (55.8 kg), last menstrual period 04/06/2019. Pregravid weight 115 lb (52.2 kg) Total Weight Gain 8 lb (3.629 kg) Urinalysis:      Fetal Status: Fetal Heart Rate (bpm): 145   Movement: Present     General:  Alert, oriented and cooperative. Patient is in no acute distress.  Skin: Skin is warm and dry. No rash noted.   Cardiovascular: Normal heart rate noted  Respiratory: Normal respiratory effort, no problems with respiration noted  Abdomen: Soft, gravid, appropriate for gestational age. Pain/Pressure: Absent     Pelvic:  Cervical exam deferred        Extremities: Normal range of motion.     ental Status: Normal mood and affect. Normal behavior. Normal judgment and thought content.   Anatomy U/S  Result Date: 08/17/2019 Patient Name: Candice Hoover DOB: 05/03/1986 MRN: [id number] ULTRASOUND REPORT Location: [facility] Date of Service: 08/17/2019 Indications:Anatomy Ultrasound Findings: Mason Jim intrauterine pregnancy is  visualized with FHR at 138 BPM. Biometrics give an (U/S) Gestational age of [redacted]w[redacted]d and an (U/S) EDD of 01/07/2020; this correlates with the clinically established Estimated Date of Delivery: 01/11/2020 Fetal presentation is Cephalic. EFW: 293 g (10 oz). Placenta: anterior. Grade: 1 AFI: subjectively normal. Anatomic survey is incomplete for RVOT, LVOT, and 4 chamber view and normal; Gender - female.  Impression: 1. [redacted]w[redacted]d Viable Singleton Intrauterine pregnancy by U/S. 2. (U/S) EDD is consistent with Clinically established Estimated Date of Delivery: 01/11/20 . 3. Normal Anatomy Scan, incomplete cardiac views Recommendations: 1.Clinical correlation with the patient's History and Physical Exam. 2. Follow up ultrasound for cardiac views. Deanna Artis, RT There is a singleton gestation with subjectively normal amniotic fluid volume. The fetal biometry correlates with established dating. Detailed evaluation of the fetal anatomy was performed.The fetal anatomical survey appears within normal limits within the resolution of ultrasound as described above.  Not all structures were able to be visualized on today's study.  It is recommended that a follow-up ultrasound be performed to complete visualization of the unobserved structures today.  It must be noted that a normal ultrasound is unable to rule out fetal aneuploidy nor is it able to detect all possible malformations.    The ultrasound images and findings were reviewed by me and I agree with the above report. Thomasene Mohair, MD, [facility], [facility] 08/17/2019 11:34 AM     US OB Follow Up  Result Date: 09/03/2019 Patient Name: Candice Hoover DOB: Sep 06, 1985 MRN: [id number] ULTRASOUND REPORT Location: [facility] Date of Service: 09/03/2019 Indications: Anatomy follow up ultrasound Findings: Mason Jim intrauterine pregnancy is visualized with FHR at 151 BPM. Fetal presentation is Breech. Placenta: anterior. Grade: 1 AFI: subjectively  normal. Anatomic survey is complete. There is no free peritoneal fluid in  the cul de sac. Impression: 1. [redacted]w[redacted]d Viable Singleton Intrauterine pregnancy previously established criteria. 2. Normal Anatomy Scan is now complete. All heart views seen. Recommendations: 1.Clinical correlation with the patient's History and Physical Exam. Gweneth Dimitri, RT There is a singleton gestation with subjectively normal amniotic fluid volume.  Limited evaluation of the fetal anatomy was performed today, focusing on on anatomic structures not fully visualized at the time of prior study.The visualized fetal anatomical survey appears within normal limits within the resolution of ultrasound as described above, and the anatomic survey is now complete.  It must be noted that a normal ultrasound is unable to rule out fetal aneuploidy, subtle defects such as small ASD or VDS may also not be visible on imaging. Malachy Mood, MD, [facility], [facility] 09/03/2019, 2:27 PM     Assessment   34 y.o. G3P1011 at [redacted]w[redacted]d by  01/11/2020, by Last Menstrual Period presenting for routine prenatal visit  Plan   pregnancy Problems (from 06/19/19 to present)    Problem Noted Resolved   Supervision of high risk pregnancy, antepartum 06/19/2019 by Will Bonnet, MD No   Overview Addendum 08/17/2019  1:12 PM by Will Bonnet, MD    Clinic [facility] Prenatal Labs  Dating L=6 Blood type: B/Negative/-- (12/01 1227)   Genetic Screen NIPS: diploid XX Antibody:Positive, See Final Results (12/01 1227)  Anatomic [place] Incomplete 08/17/19 Rubella: 8.78 (12/01 1227)  Varicella: Immune  GTT Early: 72       Third trimester:  RPR: Non Reactive (12/01 1227)   Rhogam Per protocol HBsAg: Negative (12/01 1227)   TDaP vaccine  [ ]  28-32 weeks                      Flu Shot: 06/19/2019 HIV: Non Reactive (12/01 1227)   Baby Food                                GBS:   Contraception  Pap:  CBB     CS/VBAC    Support Person  Hubby           History of gestational diabetes 06/19/2019 by Will Bonnet, MD No   Overview Addendum 08/17/2019  1:12 PM by Will Bonnet, MD    [x]  early 1h gtt = 112      Rh negative state in antepartum period 06/19/2019 by Will Bonnet, MD No   Overview Signed 06/19/2019 10:58 AM by Will Bonnet, MD    [ ]  rhogam per protocol          Gestational age appropriate obstetric precautions including but not limited to vaginal bleeding, contractions, leaking of fluid and fetal movement were reviewed in detail with the patient.    Return in about 4 weeks (around 10/01/2019) for ROB.  Malachy Mood, MD, [facility], [facility] 09/03/2019, 2:40 PM

## 2019-10-01 ENCOUNTER — Other Ambulatory Visit: Payer: Self-pay

## 2019-10-01 ENCOUNTER — Ambulatory Visit (INDEPENDENT_AMBULATORY_CARE_PROVIDER_SITE_OTHER): Payer: BC Managed Care – PPO | Admitting: Obstetrics and Gynecology

## 2019-10-01 ENCOUNTER — Encounter: Payer: Self-pay | Admitting: Obstetrics and Gynecology

## 2019-10-01 VITALS — BP 116/70 | Wt 127.0 lb

## 2019-10-01 DIAGNOSIS — O0992 Supervision of high risk pregnancy, unspecified, second trimester: Secondary | ICD-10-CM

## 2019-10-01 DIAGNOSIS — O0990 Supervision of high risk pregnancy, unspecified, unspecified trimester: Secondary | ICD-10-CM

## 2019-10-01 DIAGNOSIS — O26892 Other specified pregnancy related conditions, second trimester: Secondary | ICD-10-CM

## 2019-10-01 DIAGNOSIS — Z3A25 25 weeks gestation of pregnancy: Secondary | ICD-10-CM

## 2019-10-01 DIAGNOSIS — Z6791 Unspecified blood type, Rh negative: Secondary | ICD-10-CM

## 2019-10-01 DIAGNOSIS — Z8632 Personal history of gestational diabetes: Secondary | ICD-10-CM

## 2019-10-01 NOTE — Progress Notes (Signed)
Routine Prenatal Care Visit  Subjective  Candice Hoover is a 34 y.o. G3P1011 at [redacted]w[redacted]d being seen today for ongoing prenatal care.  She is currently monitored for the following issues for this low-risk pregnancy and has Supervision of high risk pregnancy, antepartum; History of gestational diabetes; and Rh negative state in antepartum period on their problem list.  ----------------------------------------------------------------------------------- Patient reports no complaints.   Contractions: Not present. Vag. Bleeding: None.  Movement: Present. Leaking Fluid denies.  ----------------------------------------------------------------------------------- The following portions of the patient's history were reviewed and updated as appropriate: allergies, current medications, past family history, past medical history, past social history, past surgical history and problem list. Problem list updated.  Objective  Blood pressure 116/70, weight 127 lb (57.6 kg), last menstrual period 04/06/2019. Pregravid weight 115 lb (52.2 kg) Total Weight Gain 12 lb (5.443 kg) Urinalysis: Urine Protein    Urine Glucose    Fetal Status: Fetal Heart Rate (bpm): 150 Fundal Height: 25 cm Movement: Present     General:  Alert, oriented and cooperative. Patient is in no acute distress.  Skin: Skin is warm and dry. No rash noted.   Cardiovascular: Normal heart rate noted  Respiratory: Normal respiratory effort, no problems with respiration noted  Abdomen: Soft, gravid, appropriate for gestational age. Pain/Pressure: Absent     Pelvic:  Cervical exam deferred        Extremities: Normal range of motion.     Mental Status: Normal mood and affect. Normal behavior. Normal judgment and thought content.   Assessment   34 y.o. 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 Antibody:Positive, See Final Results (12/01 1227)  Anatomic [place] Incomplete 08/17/19 Rubella: 8.78 (12/01 1227)  Varicella: Immune  GTT Early: 112       Third trimester:  RPR: Non Reactive (12/01 1227)   Rhogam Per protocol HBsAg: Negative (12/01 1227)   TDaP vaccine  [ ]  28-32 weeks                      Flu Shot: 06/19/2019 HIV: Non Reactive (12/01 1227)   Baby Food                                GBS:   Contraception  Pap:  CBB     CS/VBAC    Support Person Hubby         History of gestational diabetes 06/19/2019 by 14/07/2018, MD No   Overview Addendum 08/17/2019  1:12 PM by 08/19/2019, MD    [x]  early 1h gtt = 112      Rh negative state in antepartum period 06/19/2019 by , MD No   Overview Signed 06/19/2019 10:58 AM by Conard Novak, MD    [ ]  rhogam per protocol         Preterm labor symptoms and general obstetric precautions including but not limited to vaginal bleeding, contractions, leaking of fluid and fetal movement were reviewed in detail with the patient. Please  refer to After Visit Summary for other counseling recommendations.   Return in about 2 weeks (around 10/15/2019) for 1 hout gtt/28 week labs, Routine Prenatal Appointment.  Prentice Docker, MD, [facility], [facility] 10/01/2019 2:40 PM

## 2019-10-15 ENCOUNTER — Ambulatory Visit (INDEPENDENT_AMBULATORY_CARE_PROVIDER_SITE_OTHER): Payer: BC Managed Care – PPO | Admitting: Obstetrics and Gynecology

## 2019-10-15 ENCOUNTER — Other Ambulatory Visit: Payer: Self-pay

## 2019-10-15 ENCOUNTER — Other Ambulatory Visit: Payer: BC Managed Care – PPO

## 2019-10-15 ENCOUNTER — Encounter: Payer: BC Managed Care – PPO | Admitting: Obstetrics and Gynecology

## 2019-10-15 VITALS — BP 104/50 | Wt 129.0 lb

## 2019-10-15 DIAGNOSIS — Z3A27 27 weeks gestation of pregnancy: Secondary | ICD-10-CM | POA: Diagnosis not present

## 2019-10-15 DIAGNOSIS — O0990 Supervision of high risk pregnancy, unspecified, unspecified trimester: Secondary | ICD-10-CM

## 2019-10-15 DIAGNOSIS — O360120 Maternal care for anti-D [Rh] antibodies, second trimester, not applicable or unspecified: Secondary | ICD-10-CM

## 2019-10-15 DIAGNOSIS — O26899 Other specified pregnancy related conditions, unspecified trimester: Secondary | ICD-10-CM

## 2019-10-15 DIAGNOSIS — Z6791 Unspecified blood type, Rh negative: Secondary | ICD-10-CM

## 2019-10-15 MED ORDER — RHO D IMMUNE GLOBULIN 1500 UNIT/2ML IJ SOSY
300.0000 ug | PREFILLED_SYRINGE | Freq: Once | INTRAMUSCULAR | Status: AC
  Administered 2019-10-15: 300 ug via INTRAMUSCULAR

## 2019-10-15 NOTE — Progress Notes (Signed)
ROB 28 week labs 

## 2019-10-15 NOTE — Progress Notes (Signed)
Routine Prenatal Care Visit  Subjective  Candice Hoover is a 34 y.o. G3P1011 at [redacted]w[redacted]d being seen today for ongoing prenatal care.  She is currently monitored for the following issues for this low-risk pregnancy and has Supervision of high risk pregnancy, antepartum; History of gestational diabetes; and Rh negative state in antepartum period on their problem list.  ----------------------------------------------------------------------------------- Patient reports no complaints.    .  .   . Denies leaking of fluid.  ----------------------------------------------------------------------------------- The following portions of the patient's history were reviewed and updated as appropriate: allergies, current medications, past family history, past medical history, past social history, past surgical history and problem list. Problem list updated.   Objective  Blood pressure (!) 104/50, weight 129 lb (58.5 kg), last menstrual period 04/06/2019. Pregravid weight 115 lb (52.2 kg) Total Weight Gain 14 lb (6.35 kg) Urinalysis:      Fetal Status:           General:  Alert, oriented and cooperative. Patient is in no acute distress.  Skin: Skin is warm and dry. No rash noted.   Cardiovascular: Normal heart rate noted  Respiratory: Normal respiratory effort, no problems with respiration noted  Abdomen: Soft, gravid, appropriate for gestational age.       Pelvic:  Cervical exam deferred        Extremities: Normal range of motion.     ental Status: Normal mood and affect. Normal behavior. Normal judgment and thought content.     Assessment   34 y.o. 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 Antibody:Positive, See Final Results (12/01 1227)  Anatomic [place] Incomplete 08/17/19 Complete 09/03/19 Rubella: 8.78 (12/01 1227)  Varicella: Immune  GTT Early: 112       Third trimester:  RPR: Non Reactive (12/01 1227)   Rhogam Per protocol 10/15/2019 HBsAg: Negative (12/01 1227)   TDaP vaccine  [ ]  28-32 weeks                      Flu Shot: 06/19/2019 HIV: Non Reactive (12/01 1227)   Baby Food                                GBS:   Contraception  Pap: 06/19/2019 NIL HPV negative  CBB     CS/VBAC    Support Person Hubby           History of gestational diabetes 06/19/2019 by 14/07/2018, MD No   Overview Addendum 08/17/2019  1:12 PM by 08/19/2019, MD    [x]  early 1h gtt = 112      Rh negative state in antepartum period 06/19/2019 by , MD No   Overview Signed 06/19/2019 10:58 AM by Conard Novak, MD    [X]  rhogam per protocol          Gestational age appropriate obstetric precautions including but not limited to vaginal bleeding, contractions, leaking of fluid and fetal movement were reviewed in detail with the  patient.   - 28 week labs and rhogam today   No follow-ups on file.  Malachy Mood, MD, [facility], [facility] 10/15/2019, 3:32 PM

## 2019-10-17 LAB — AB SCR+ANTIBODY ID: Antibody Screen: POSITIVE — AB

## 2019-10-17 LAB — 28 WEEKS RH-PANEL
Basophils Absolute: 0.1 10*3/uL (ref 0.0–0.2)
Basos: 1 %
EOS (ABSOLUTE): 0.1 10*3/uL (ref 0.0–0.4)
Eos: 1 %
Gestational Diabetes Screen: 103 mg/dL (ref 65–139)
HIV Screen 4th Generation wRfx: NONREACTIVE
Hematocrit: 32.4 % — ABNORMAL LOW (ref 34.0–46.6)
Hemoglobin: 10.9 g/dL — ABNORMAL LOW (ref 11.1–15.9)
Immature Grans (Abs): 0.2 10*3/uL — ABNORMAL HIGH (ref 0.0–0.1)
Immature Granulocytes: 2 %
Lymphocytes Absolute: 2.9 10*3/uL (ref 0.7–3.1)
Lymphs: 23 %
MCH: 30.5 pg (ref 26.6–33.0)
MCHC: 33.6 g/dL (ref 31.5–35.7)
MCV: 91 fL (ref 79–97)
Monocytes Absolute: 0.9 10*3/uL (ref 0.1–0.9)
Monocytes: 7 %
Neutrophils Absolute: 8.3 10*3/uL — ABNORMAL HIGH (ref 1.4–7.0)
Neutrophils: 66 %
Platelets: 252 10*3/uL (ref 150–450)
RBC: 3.57 x10E6/uL — ABNORMAL LOW (ref 3.77–5.28)
RDW: 12.9 % (ref 11.7–15.4)
RPR Ser Ql: NONREACTIVE
WBC: 12.5 10*3/uL — ABNORMAL HIGH (ref 3.4–10.8)

## 2019-10-29 ENCOUNTER — Encounter: Payer: Self-pay | Admitting: Obstetrics and Gynecology

## 2019-10-29 ENCOUNTER — Other Ambulatory Visit: Payer: Self-pay

## 2019-10-29 ENCOUNTER — Ambulatory Visit (INDEPENDENT_AMBULATORY_CARE_PROVIDER_SITE_OTHER): Payer: BC Managed Care – PPO | Admitting: Obstetrics and Gynecology

## 2019-10-29 VITALS — BP 114/74 | Wt 132.0 lb

## 2019-10-29 DIAGNOSIS — Z6791 Unspecified blood type, Rh negative: Secondary | ICD-10-CM

## 2019-10-29 DIAGNOSIS — O0993 Supervision of high risk pregnancy, unspecified, third trimester: Secondary | ICD-10-CM

## 2019-10-29 DIAGNOSIS — O26893 Other specified pregnancy related conditions, third trimester: Secondary | ICD-10-CM

## 2019-10-29 DIAGNOSIS — Z3A29 29 weeks gestation of pregnancy: Secondary | ICD-10-CM

## 2019-10-29 DIAGNOSIS — Z8632 Personal history of gestational diabetes: Secondary | ICD-10-CM

## 2019-10-29 NOTE — Progress Notes (Signed)
Routine Prenatal Care Visit  Subjective  Candice Hoover is a 34 y.o. G3P1011 at [redacted]w[redacted]d being seen today for ongoing prenatal care.  She is currently monitored for the following issues for this low-risk pregnancy and has Supervision of high risk pregnancy, antepartum; History of gestational diabetes; and Rh negative state in antepartum period on their problem list.  ----------------------------------------------------------------------------------- Patient reports no complaints.   Contractions: Not present. Vag. Bleeding: None.  Movement: Present. Leaking Fluid denies.  ----------------------------------------------------------------------------------- The following portions of the patient's history were reviewed and updated as appropriate: allergies, current medications, past family history, past medical history, past social history, past surgical history and problem list. Problem list updated.  Objective  Blood pressure 114/74, weight 132 lb (59.9 kg), last menstrual period 04/06/2019. Pregravid weight 115 lb (52.2 kg) Total Weight Gain 17 lb (7.711 kg) Urinalysis: Urine Protein    Urine Glucose    Fetal Status: Fetal Heart Rate (bpm): 150 Fundal Height: 29 cm Movement: Present  Presentation: Vertex  General:  Alert, oriented and cooperative. Patient is in no acute distress.  Skin: Skin is warm and dry. No rash noted.   Cardiovascular: Normal heart rate noted  Respiratory: Normal respiratory effort, no problems with respiration noted  Abdomen: Soft, gravid, appropriate for gestational age. Pain/Pressure: Absent     Pelvic:  Cervical exam deferred        Extremities: Normal range of motion.     Mental Status: Normal mood and affect. Normal behavior. Normal judgment and thought content.   Assessment   34 y.o. 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 Antibody:Positive, See Final Results (12/01 1227)  Anatomic [place] Incomplete 08/17/19; Complete 09/03/19 Rubella: 8.78 (12/01 1227)  Varicella: Immune  GTT Early: 112       Third trimester:  RPR: Non Reactive (12/01 1227)   Rhogam Per protocol 10/15/19 HBsAg: Negative (12/01 1227)   TDaP vaccine  [ ]  28-32 weeks                      Flu Shot: 06/19/2019 HIV: Non Reactive (12/01 1227)   Baby Food                                GBS:   Contraception  Pap: 06/19/2019 NIL HPV negative  CBB     CS/VBAC    Support Person Hubby           History of gestational diabetes 06/19/2019 by 14/07/2018, MD No   Overview Addendum 08/17/2019  1:12 PM by 08/19/2019, MD    [x]  early 1h gtt = 112      Rh negative state in antepartum period 06/19/2019 by , MD No   Overview Signed 06/19/2019 10:58 AM by Conard Novak, MD    [ ]  rhogam per protocol          Preterm labor symptoms and general obstetric precautions including but not limited to vaginal bleeding, contractions, leaking of fluid and fetal  movement were reviewed in detail with the patient. Please refer to After Visit Summary for other counseling recommendations.   Return in about 2 weeks (around 11/12/2019) for Routine Prenatal Appointment.  Prentice Docker, MD, [facility], [facility] 10/29/2019 5:39 PM

## 2019-11-11 ENCOUNTER — Observation Stay
Admission: EM | Admit: 2019-11-11 | Discharge: 2019-11-11 | Disposition: A | Discharge status: Home / Self Care | Payer: BC Managed Care – PPO | Attending: Obstetrics & Gynecology | Admitting: Obstetrics & Gynecology

## 2019-11-11 DIAGNOSIS — O99613 Diseases of the digestive system complicating pregnancy, third trimester: Secondary | ICD-10-CM | POA: Insufficient documentation

## 2019-11-11 DIAGNOSIS — O26899 Other specified pregnancy related conditions, unspecified trimester: Secondary | ICD-10-CM | POA: Diagnosis not present

## 2019-11-11 DIAGNOSIS — Z3A31 31 weeks gestation of pregnancy: Secondary | ICD-10-CM | POA: Diagnosis not present

## 2019-11-11 DIAGNOSIS — K219 Gastro-esophageal reflux disease without esophagitis: Secondary | ICD-10-CM | POA: Diagnosis not present

## 2019-11-11 DIAGNOSIS — K529 Noninfective gastroenteritis and colitis, unspecified: Secondary | ICD-10-CM | POA: Insufficient documentation

## 2019-11-11 DIAGNOSIS — R109 Unspecified abdominal pain: Secondary | ICD-10-CM | POA: Diagnosis not present

## 2019-11-11 DIAGNOSIS — O26893 Other specified pregnancy related conditions, third trimester: Principal | ICD-10-CM | POA: Insufficient documentation

## 2019-11-11 LAB — CBC
HCT: 35.5 % — ABNORMAL LOW (ref 36.0–46.0)
Hemoglobin: 11.8 g/dL — ABNORMAL LOW (ref 12.0–15.0)
MCH: 29.2 pg (ref 26.0–34.0)
MCHC: 33.2 g/dL (ref 30.0–36.0)
MCV: 87.9 fL (ref 80.0–100.0)
Platelets: 222 10*3/uL (ref 150–400)
RBC: 4.04 MIL/uL (ref 3.87–5.11)
RDW: 13.1 % (ref 11.5–15.5)
WBC: 17.2 10*3/uL — ABNORMAL HIGH (ref 4.0–10.5)
nRBC: 0 % (ref 0.0–0.2)

## 2019-11-11 LAB — SAMPLE TO BLOOD BANK

## 2019-11-11 LAB — COMPREHENSIVE METABOLIC PANEL
ALT: 17 U/L (ref 0–44)
AST: 22 U/L (ref 15–41)
Albumin: 3.1 g/dL — ABNORMAL LOW (ref 3.5–5.0)
Alkaline Phosphatase: 86 U/L (ref 38–126)
Anion gap: 9 (ref 5–15)
BUN: 11 mg/dL (ref 6–20)
CO2: 25 mmol/L (ref 22–32)
Calcium: 8.7 mg/dL — ABNORMAL LOW (ref 8.9–10.3)
Chloride: 104 mmol/L (ref 98–111)
Creatinine, Ser: 0.59 mg/dL (ref 0.44–1.00)
GFR calc Af Amer: >60 mL/min (ref >60)
GFR calc non Af Amer: >60 mL/min (ref >60)
Glucose, Bld: 130 mg/dL — ABNORMAL HIGH (ref 70–99)
Potassium: 4.2 mmol/L (ref 3.5–5.1)
Sodium: 138 mmol/L (ref 135–145)
Total Bilirubin: 0.7 mg/dL (ref 0.3–1.2)
Total Protein: 6.6 g/dL (ref 6.5–8.1)

## 2019-11-11 MED ORDER — FAMOTIDINE 40 MG/5ML PO SUSR
20.0000 mg | Freq: Once | ORAL | Status: AC
  Administered 2019-11-11: 10:00:00 20 mg via ORAL
  Filled 2019-11-11: qty 2.5

## 2019-11-11 MED ORDER — ACETAMINOPHEN 325 MG PO TABS: 650.0000 mg | ORAL_TABLET | ORAL | Status: DC | PRN

## 2019-11-11 MED ORDER — ONDANSETRON HCL 4 MG/2ML IJ SOLN
4.0000 mg | Freq: Four times a day (QID) | INTRAMUSCULAR | Status: DC | PRN
  Administered 2019-11-11: 10:00:00 4 mg via INTRAVENOUS
  Filled 2019-11-11: qty 2

## 2019-11-11 MED ORDER — LIDOCAINE HCL (PF) 1 % IJ SOLN: 30.0000 mL | INTRAMUSCULAR | Status: DC | PRN

## 2019-11-11 MED ORDER — LACTATED RINGERS IV BOLUS
500.0000 mL | Freq: Once | INTRAVENOUS | Status: AC
  Administered 2019-11-11: 500 mL via INTRAVENOUS

## 2019-11-11 NOTE — Discharge Instructions (Signed)
GERD During Pregnancy GERD and Heartburn is a type of pain or discomfort that can happen in the throat or chest. It is often described as a burning sensation. Heartburn is common during pregnancy because:  A hormone (progesterone) that is released during pregnancy may relax the valve (lower esophageal sphincter, or LES) that separates the esophagus from the stomach. This allows stomach acid to move up into the esophagus, causing heartburn.  The uterus gets larger and pushes up on the stomach, which pushes more acid into the esophagus. This is especially true in the later stages of pregnancy. Heartburn usually goes away or gets better after giving birth. What are the causes? Heartburn is caused by stomach acid backing up into the esophagus (reflux). Reflux can be triggered by:  Changing hormone levels.  Large meals.  Certain foods and beverages, such as coffee, chocolate, onions, and peppermint.  Exercise.  Increased stomach acid production. What increases the risk? You are more likely to experience heartburn during pregnancy if you:  Had heartburn prior to becoming pregnant.  Have been pregnant more than once before.  Are overweight or obese. The likelihood that you will get heartburn also increases as you get farther along in your pregnancy, especially during the last trimester. What are the signs or symptoms? Symptoms of this condition include:  Burning pain in the chest or lower throat.  Bitter taste in the mouth.  Coughing.  Problems swallowing.  Vomiting.  Hoarse voice.  Asthma. Symptoms may get worse when you lie down or bend over. Symptoms are often worse at night. How is this diagnosed? This condition is diagnosed based on:  Your medical history.  Your symptoms.  Blood tests to check for a certain type of bacteria associated with heartburn.  Whether taking heartburn medicine relieves your symptoms.  Examination of the stomach and esophagus using a tube  with a light and camera on the end (endoscopy). How is this treated? Treatment varies depending on how severe your symptoms are. Your health care provider may recommend:  Over-the-counter medicines (antacids or acid reducers) for mild heartburn.  Prescription medicines to decrease stomach acid or to protect your stomach lining.  Certain changes in your diet.  Raising the head of your bed so it is higher than the foot of the bed. This helps prevent stomach acid from backing up into the esophagus when you are lying down. Follow these instructions at home: Eating and drinking  Do not drink alcohol during your pregnancy.  Identify foods and beverages that make your symptoms worse, and avoid them.  Beverages that you may want to avoid include: ? Coffee and tea (with or without caffeine). ? Energy drinks and sports drinks. ? Carbonated drinks or sodas. ? Citrus fruit juices.  Foods that you may want to avoid include: ? Chocolate and cocoa. ? Peppermint and mint flavorings. ? Garlic, onions, and horseradish. ? Spicy and acidic foods, including peppers, chili powder, curry powder, vinegar, hot sauces, and barbecue sauce. ? Citrus fruits, such as oranges, lemons, and limes. ? Tomato-based foods, such as red sauce, chili, and salsa. ? Fried and fatty foods, such as donuts, french fries, potato chips, and high-fat dressings. ? High-fat meats, such as hot dogs, cold cuts, sausage, ham, and bacon. ? High-fat dairy items, such as whole milk, butter, and cheese.  Eat small, frequent meals instead of large meals.  Avoid drinking large amounts of liquid with your meals.  Avoid eating meals during the 2-3 hours before bedtime.  Avoid lying  down right after you eat.  Do not exercise right after you eat. Medicines  Take over-the-counter and prescription medicines only as told by your health care provider.  Do not take aspirin, ibuprofen, or other NSAIDs unless your health care provider  tells you to do that.  You may be instructed to avoid medicines that contain sodium bicarbonate. General instructions   If directed, raise the head of your bed about 6 inches (15 cm) by putting blocks under the legs. Sleeping with more pillows does not effectively relieve heartburn because it only changes the position of your head.  Do not use any products that contain nicotine or tobacco, such as cigarettes and e-cigarettes. If you need help quitting, ask your health care provider.  Wear loose-fitting clothing.  Try to reduce your stress, such as with yoga or meditation. If you need help managing stress, ask your health care provider.  Maintain a healthy weight. If you are overweight, work with your health care provider to safely lose weight.  Keep all follow-up visits as told by your health care provider. This is important. Contact a health care provider if:  You develop new symptoms.  Your symptoms do not improve with treatment.  You have unexplained weight loss.  You have difficulty swallowing.  You make loud sounds when you breathe (wheeze).  You have a cough that does not go away.  You have frequent heartburn for more than 2 weeks.  You have nausea or vomiting that does not get better with treatment.  You have pain in your abdomen. Get help right away if:  You have severe chest pain that spreads to your arm, neck, or jaw.  You feel sweaty, dizzy, or light-headed.  You have shortness of breath.  You have pain when swallowing.  You vomit, and your vomit looks like blood or coffee grounds.  Your stool is bloody or black. This information is not intended to replace advice given to you by your health care provider. Make sure you discuss any questions you have with your health care provider. Document Revised: 10/03/2017 Document Reviewed: 03/22/2016 Elsevier Patient Education  2020 [employer].

## 2019-11-11 NOTE — Progress Notes (Signed)
Patient discharged to home after IVF and antiemetics. Instructions gone over with patient and family who verbalized understanding and when to return to the hospital.

## 2019-11-11 NOTE — H&P (Signed)
Obstetrics Admission History & Physical   CC: Lower abdominal pain   HPI:  34 y.o. G3P1011 @ [redacted]w[redacted]d (01/11/2020, by Last Menstrual Period). Admitted on 11/11/2019:   Patient Active Problem List   Diagnosis Date Noted  . Abdominal pain affecting pregnancy 11/11/2019  . Supervision of high risk pregnancy, antepartum 06/19/2019  . History of gestational diabetes 06/19/2019  . Rh negative state in antepartum period 06/19/2019    Presents for lower abdominal pain that began last night and is mostly epigastrium and is associated with reflux and nausea and is moderate without radiation, in the context of 31 weeks pregnancy, no ctxs or VB or ROM. Tried Tums, no help. No other modifiers. 3 stools in last 24 hours, last two loose, but no diarrhea.  Says she ate a whole container of Nutella last night.  Prenatal care at: at [facility]. Pregnancy complicated by none.  ROS: A review of systems was performed and negative, except as stated in the above HPI. PMHx:  Past Medical History:  Diagnosis Date  . Gestational diabetes   . Rh negative state in antepartum period   . Varicella    PSHx:  Past Surgical History:  Procedure Laterality Date  . NO PAST SURGERIES     Medications:  Medications Prior to Admission  Medication Sig Dispense Refill Last Dose  . Prenatal Vit-Fe Fumarate-FA (PRENATAL VITAMIN PO) Take by mouth.      Allergies: has No Known Allergies. OBHx:  OB History  Gravida Para Term Preterm AB Living  3 1 1   1 1   SAB TAB Ectopic Multiple Live Births          1    # Outcome Date GA Lbr Len/2nd Weight Sex Delivery Anes PTL Lv  3 Current           2 Term           1 AB            except as detailed in HPI.HWE:XHBZJIRC/VELFYBOFBPZW  No family history of birth defects. Soc Hx: Alcohol: none and Recreational drug use: none  Objective:   Vitals:   11/11/19 0915  BP: 109/64  Pulse: (!) 115  Temp: 97.9 F (36.6 C)   Constitutional: Well nourished, well developed female in  no acute distress.  HEENT: normal Skin: Warm and dry.  Cardiovascular:Regular rate and rhythm.   Extremity: trace to 1+ bilateral pedal edema Respiratory: Clear to auscultation bilateral. Normal respiratory effort Abdomen: gravid, ND, FHT present, without guarding, without rebound, mile epigastric tenderness on exam Back: no CVAT Neuro: DTRs 2+, Cranial nerves grossly intact Psych: Alert and Oriented x3. No memory deficits. Normal mood and affect.  MS: normal gait, normal bilateral lower extremity ROM/strength/stability. Pelvic exam:deferred EFM:FHR: 150 bpm, variability: moderate,  accelerations:  Present,  decelerations:  Absent Toco: None  A NST procedure was performed with FHR monitoring and a normal baseline established, appropriate time of 20-40 minutes of evaluation, and accels >2 seen w 15x15 characteristics.  Results show a REACTIVE NST.    Perinatal info:  Blood type: B negative Rubella- Immune Varicella -Immune TDaP tetanus status unknown to the patient RPR NR / HIV Neg/ HBsAg Neg   Assessment & Plan:   34 y.o. G3P1011 @ [redacted]w[redacted]d, Admitted on 11/11/2019: Upper abdominal pains of pregnancy, GERD, Possible Gastroenteritis    Labs Fluids Rest Fetal monitoring Pepcid now Zofran prn  No s/sx PTL  11/13/2019, MD, Annamarie Major [facility]/G[facility], [facility] 11/11/2019  9:31 AM

## 2019-11-11 NOTE — Discharge Summary (Signed)
Physician Discharge Summary  Patient ID: Candice Hoover MRN: [id number] DOB/AGE: 34-Jul-1987 34 y.o.  Admit date: 11/11/2019 Discharge date: 11/11/2019  Admission Diagnoses: Abdominal pain affecting pregnancy  GERD  Gastroenteritis    31 weeks pregnancy  Discharge Diagnoses:  Principal Problem:   Abdominal pain affecting pregnancy   GERD   Gastroenteritis  Discharged Condition: good  Hospital Course: Pt seen and examined, see H&P for details.  No s.sx PTL.  IVF and Pepcid treatments helped patient feel better.  Home to rest today, fluids, nourishment  Consults: None  Significant Diagnostic Studies: labs:  Results for orders placed or performed during the hospital encounter of 11/11/19  Comprehensive metabolic panel  Result Value Ref Range   Sodium 138 135 - 145 mmol/L   Potassium 4.2 3.5 - 5.1 mmol/L   Chloride 104 98 - 111 mmol/L   CO2 25 22 - 32 mmol/L   Glucose, Bld 130 (H) 70 - 99 mg/dL   BUN 11 6 - 20 mg/dL   Creatinine, Ser 2.50 0.44 - 1.00 mg/dL   Calcium 8.7 (L) 8.9 - 10.3 mg/dL   Total Protein 6.6 6.5 - 8.1 g/dL   Albumin 3.1 (L) 3.5 - 5.0 g/dL   AST 22 15 - 41 U/L   ALT 17 0 - 44 U/L   Alkaline Phosphatase 86 38 - 126 U/L   Total Bilirubin 0.7 0.3 - 1.2 mg/dL   GFR calc non Af Amer >60 >60 mL/min   GFR calc Af Amer >60 >60 mL/min   Anion gap 9 5 - 15  CBC  Result Value Ref Range   WBC 17.2 (H) 4.0 - 10.5 K/uL   RBC 4.04 3.87 - 5.11 MIL/uL   Hemoglobin 11.8 (L) 12.0 - 15.0 g/dL   HCT 53.9 (L) 76.7 - 34.1 %   MCV 87.9 80.0 - 100.0 fL   MCH 29.2 26.0 - 34.0 pg   MCHC 33.2 30.0 - 36.0 g/dL   RDW 93.7 90.2 - 40.9 %   Platelets 222 150 - 400 K/uL   nRBC 0.0 0.0 - 0.2 %  Sample to Blood Bank  Result Value Ref Range   Blood Bank Specimen SAMPLE AVAILABLE FOR TESTING    Sample Expiration      11/14/2019,2359 Performed at [facility], [address] [place], [place] [address]     and   A NST procedure was performed with FHR monitoring and  a normal baseline established, appropriate time of 20-40 minutes of evaluation, and accels >2 seen w 15x15 characteristics.  Results show a REACTIVE NST.   Treatments: IV hydration  Discharge Exam: Blood pressure 109/64, pulse (!) 115, temperature 97.9 F (36.6 C), temperature source Axillary, last menstrual period 04/06/2019. no change  Disposition: Discharge disposition: 01-Home or Self Care       Discharge Instructions    Call MD for:   Complete by: As directed    Worsening contractions or pain; leakage of fluid; bleeding.   Diet general   Complete by: As directed    Increase activity slowly   Complete by: As directed      Allergies as of 11/11/2019   No Known Allergies     Medication List    TAKE these medications   PRENATAL VITAMIN PO Take by mouth.        Signed: Letitia Libra 11/11/2019, 10:39 AM

## 2019-11-11 NOTE — OB Triage Note (Signed)
Patient here for complaints of constant abdominal pain and acid reflux. She states that she committed last night x2 and had loose stools x3. She took Tums for her reflux but that seemed to make it worse. It is also worse when she drinks.

## 2019-11-16 ENCOUNTER — Ambulatory Visit (INDEPENDENT_AMBULATORY_CARE_PROVIDER_SITE_OTHER): Payer: BC Managed Care – PPO | Admitting: Obstetrics and Gynecology

## 2019-11-16 ENCOUNTER — Encounter: Payer: Self-pay | Admitting: Obstetrics and Gynecology

## 2019-11-16 ENCOUNTER — Other Ambulatory Visit: Payer: Self-pay

## 2019-11-16 VITALS — BP 110/70 | Wt 131.0 lb

## 2019-11-16 DIAGNOSIS — Z8632 Personal history of gestational diabetes: Secondary | ICD-10-CM

## 2019-11-16 DIAGNOSIS — Z3A32 32 weeks gestation of pregnancy: Secondary | ICD-10-CM

## 2019-11-16 DIAGNOSIS — O0993 Supervision of high risk pregnancy, unspecified, third trimester: Secondary | ICD-10-CM

## 2019-11-16 DIAGNOSIS — L299 Pruritus, unspecified: Secondary | ICD-10-CM

## 2019-11-16 DIAGNOSIS — Z6791 Unspecified blood type, Rh negative: Secondary | ICD-10-CM

## 2019-11-16 DIAGNOSIS — O26899 Other specified pregnancy related conditions, unspecified trimester: Secondary | ICD-10-CM

## 2019-11-16 NOTE — Progress Notes (Signed)
Routine Prenatal Care Visit  Subjective  Candice Hoover is a 34 y.o. G3P1011 at [redacted]w[redacted]d being seen today for ongoing prenatal care.  She is currently monitored for the following issues for this low-risk pregnancy and has Supervision of high risk pregnancy, antepartum; History of gestational diabetes; Rh negative state in antepartum period; and Abdominal pain affecting pregnancy on their problem list.  ----------------------------------------------------------------------------------- Patient reports all-over itching, including palms and soles. No skin changes, no new detergents, soaps, medications, fabric exposures.    Contractions: Not present. Vag. Bleeding: None.  Movement: Present. Leaking Fluid denies.  ----------------------------------------------------------------------------------- The following portions of the patient's history were reviewed and updated as appropriate: allergies, current medications, past family history, past medical history, past social history, past surgical history and problem list. Problem list updated.  Objective  Blood pressure 110/70, weight 131 lb (59.4 kg), last menstrual period 04/06/2019. Pregravid weight 115 lb (52.2 kg) Total Weight Gain 16 lb (7.258 kg) Urinalysis: Urine Protein    Urine Glucose    Fetal Status: Fetal Heart Rate (bpm): 140 Fundal Height: 31 cm Movement: Present     General:  Alert, oriented and cooperative. Patient is in no acute distress.  Skin: Skin is warm and dry. No rash noted.   Cardiovascular: Normal heart rate noted  Respiratory: Normal respiratory effort, no problems with respiration noted  Abdomen: Soft, gravid, appropriate for gestational age. Pain/Pressure: Absent     Pelvic:  Cervical exam deferred        Extremities: Normal range of motion.     Mental Status: Normal mood and affect. Normal behavior. Normal judgment and thought content.   Assessment   34 y.o. 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 Antibody:Positive, See Final Results (12/01 1227)  Anatomic [place] Incomplete 08/17/19; Complete 09/03/19 Rubella: 8.78 (12/01 1227)  Varicella: Immune  GTT Early: 112       Third trimester:  RPR: Non Reactive (12/01 1227)   Rhogam Per protocol 10/15/19 HBsAg: Negative (12/01 1227)   TDaP vaccine  [ ]  28-32 weeks                      Flu Shot: 06/19/2019 HIV: Non Reactive (12/01 1227)   Baby Food                                GBS:   Contraception  Pap: 06/19/2019 NIL HPV negative  CBB     CS/VBAC    Support Person Hubby           History of gestational diabetes 06/19/2019 by 14/07/2018, MD No   Overview Addendum 08/17/2019  1:12 PM by 08/19/2019, MD    [x]  early 1h gtt = 112      Rh negative state in antepartum period 06/19/2019 by , MD No   Overview Signed 06/19/2019 10:58 AM by Conard Novak, MD    [ ]  rhogam per protocol          Preterm  labor symptoms and general obstetric precautions including but not limited to vaginal bleeding, contractions, leaking of fluid and fetal movement were reviewed in detail with the patient. Please refer to After Visit Summary for other counseling recommendations.   - total bile acids in 3 days.   Return in about 2 weeks (around 11/30/2019) for Routine Prenatal Appointment (Monday for lab draw).  Prentice Docker, MD, [facility], [facility] 11/16/2019 11:36 AM

## 2019-11-19 ENCOUNTER — Other Ambulatory Visit: Payer: Self-pay

## 2019-11-19 ENCOUNTER — Other Ambulatory Visit: Payer: BC Managed Care – PPO

## 2019-11-19 DIAGNOSIS — L299 Pruritus, unspecified: Secondary | ICD-10-CM

## 2019-11-19 DIAGNOSIS — O0993 Supervision of high risk pregnancy, unspecified, third trimester: Secondary | ICD-10-CM

## 2019-11-19 DIAGNOSIS — Z3A32 32 weeks gestation of pregnancy: Secondary | ICD-10-CM

## 2019-11-21 LAB — BILE ACIDS, TOTAL: Bile Acids Total: 9.1 umol/L (ref 0.0–10.0)

## 2019-11-26 ENCOUNTER — Other Ambulatory Visit: Payer: Self-pay | Admitting: Obstetrics and Gynecology

## 2019-11-26 DIAGNOSIS — L299 Pruritus, unspecified: Secondary | ICD-10-CM | POA: Insufficient documentation

## 2019-11-26 DIAGNOSIS — O0990 Supervision of high risk pregnancy, unspecified, unspecified trimester: Secondary | ICD-10-CM

## 2019-11-26 MED ORDER — HYDROXYZINE HCL 25 MG PO TABS: 25.0000 mg | ORAL_TABLET | Freq: Four times a day (QID) | ORAL | 2 refills | Status: DC | PRN

## 2019-11-30 ENCOUNTER — Encounter: Payer: Self-pay | Admitting: Obstetrics and Gynecology

## 2019-11-30 ENCOUNTER — Ambulatory Visit (INDEPENDENT_AMBULATORY_CARE_PROVIDER_SITE_OTHER): Payer: BC Managed Care – PPO | Admitting: Obstetrics and Gynecology

## 2019-11-30 ENCOUNTER — Other Ambulatory Visit: Payer: Self-pay

## 2019-11-30 VITALS — BP 118/74 | Wt 134.0 lb

## 2019-11-30 DIAGNOSIS — Z8632 Personal history of gestational diabetes: Secondary | ICD-10-CM

## 2019-11-30 DIAGNOSIS — O0993 Supervision of high risk pregnancy, unspecified, third trimester: Secondary | ICD-10-CM

## 2019-11-30 DIAGNOSIS — O26899 Other specified pregnancy related conditions, unspecified trimester: Secondary | ICD-10-CM

## 2019-11-30 DIAGNOSIS — Z3A34 34 weeks gestation of pregnancy: Secondary | ICD-10-CM

## 2019-11-30 DIAGNOSIS — Z6791 Unspecified blood type, Rh negative: Secondary | ICD-10-CM

## 2019-11-30 NOTE — Progress Notes (Signed)
Routine Prenatal Care Visit  Subjective  Candice Hoover is a 34 y.o. G3P1011 at [redacted]w[redacted]d being seen today for ongoing prenatal care.  She is currently monitored for the following issues for this high-risk pregnancy and has Supervision of high risk pregnancy, antepartum; History of gestational diabetes; Rh negative state in antepartum period; Abdominal pain affecting pregnancy; and Itching on their problem list.  ----------------------------------------------------------------------------------- Patient reports some lower abdominal pain and left inguinal pain and swelling, especially at the end of the day.   Contractions: Not present. Vag. Bleeding: None.  Movement: Present. Leaking Fluid denies.  ----------------------------------------------------------------------------------- The following portions of the patient's history were reviewed and updated as appropriate: allergies, current medications, past family history, past medical history, past social history, past surgical history and problem list. Problem list updated.  Objective  Blood pressure 118/74, weight 134 lb (60.8 kg), last menstrual period 04/06/2019. Pregravid weight 115 lb (52.2 kg) Total Weight Gain 19 lb (8.618 kg) Urinalysis: Urine Protein    Urine Glucose    Fetal Status: Fetal Heart Rate (bpm): 140 Fundal Height: 34 cm Movement: Present  Presentation: Vertex  General:  Alert, oriented and cooperative. Patient is in no acute distress.  Skin: Skin is warm and dry. No rash noted.   Cardiovascular: Normal heart rate noted  Respiratory: Normal respiratory effort, no problems with respiration noted  Abdomen: Soft, gravid, appropriate for gestational age. Pain/Pressure: Present     Pelvic:  Cervical exam deferred        Extremities: Normal range of motion.     Mental Status: Normal mood and affect. Normal behavior. Normal judgment and thought content.   Assessment   34 y.o. 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, See Final Results (12/01 1227)  Anatomic [place] Incomplete 08/17/19; Complete 09/03/19 Rubella: 8.78 (12/01 1227)  Varicella: Immune  GTT Early: 112       Third trimester:  RPR: Non Reactive (12/01 1227)   Rhogam Per protocol 10/15/19 HBsAg: Negative (12/01 1227)   TDaP vaccine  [ ]  28-32 weeks                      Flu Shot: 06/19/2019 HIV: Non Reactive (12/01 1227)   Baby Food                                GBS:   Contraception  Pap: 06/19/2019 NIL HPV negative  CBB     CS/VBAC    Support Person Hubby           History of gestational diabetes 06/19/2019 by 14/07/2018, MD No   Overview Addendum 08/17/2019  1:12 PM by 08/19/2019, MD    [x]  early 1h gtt = 112      Rh negative state in antepartum period 06/19/2019 by , MD No   Overview Signed 06/19/2019 10:58 AM by Conard Novak, MD    [ ]  rhogam per  protocol          Preterm labor symptoms and general obstetric precautions including but not limited to vaginal bleeding, contractions, leaking of fluid and fetal movement were reviewed in detail with the patient. Please refer to After Visit Summary for other counseling recommendations.   Return in about 2 weeks (around 12/14/2019) for Routine Prenatal Appointment w SDJ (may make for 3, 4, and 5 weeks with SDJ).  Prentice Docker, MD, [facility], [facility] 11/30/2019 4:01 PM

## 2019-12-14 ENCOUNTER — Encounter: Payer: Self-pay | Admitting: Obstetrics and Gynecology

## 2019-12-14 ENCOUNTER — Other Ambulatory Visit (HOSPITAL_COMMUNITY)
Admission: RE | Admit: 2019-12-14 | Discharge: 2019-12-14 | Disposition: A | Discharge status: Home / Self Care | Payer: BC Managed Care – PPO | Source: Ambulatory Visit | Attending: Obstetrics and Gynecology | Admitting: Obstetrics and Gynecology

## 2019-12-14 ENCOUNTER — Ambulatory Visit (INDEPENDENT_AMBULATORY_CARE_PROVIDER_SITE_OTHER): Payer: BC Managed Care – PPO | Admitting: Obstetrics and Gynecology

## 2019-12-14 ENCOUNTER — Other Ambulatory Visit: Payer: Self-pay

## 2019-12-14 VITALS — BP 118/76 | Wt 135.0 lb

## 2019-12-14 DIAGNOSIS — Z3A36 36 weeks gestation of pregnancy: Secondary | ICD-10-CM

## 2019-12-14 DIAGNOSIS — O0990 Supervision of high risk pregnancy, unspecified, unspecified trimester: Secondary | ICD-10-CM | POA: Insufficient documentation

## 2019-12-14 DIAGNOSIS — Z6791 Unspecified blood type, Rh negative: Secondary | ICD-10-CM

## 2019-12-14 DIAGNOSIS — O26899 Other specified pregnancy related conditions, unspecified trimester: Secondary | ICD-10-CM

## 2019-12-14 DIAGNOSIS — L299 Pruritus, unspecified: Secondary | ICD-10-CM

## 2019-12-14 DIAGNOSIS — Z8632 Personal history of gestational diabetes: Secondary | ICD-10-CM

## 2019-12-14 NOTE — Addendum Note (Signed)
Addended by: Ova Freshwater R on: 12/14/2019 10:11 AM   Modules accepted: Orders

## 2019-12-14 NOTE — Progress Notes (Signed)
Routine Prenatal Care Visit  Subjective  Candice Hoover is a 34 y.o. G3P1011 at [redacted]w[redacted]d being seen today for ongoing prenatal care.  She is currently monitored for the following issues for this high-risk pregnancy and has Supervision of high risk pregnancy, antepartum; History of gestational diabetes; Rh negative state in antepartum period; Abdominal pain affecting pregnancy; and Itching on their problem list.  ----------------------------------------------------------------------------------- Patient reports continued itching. This includes palms and soles.   Contractions: Not present. Vag. Bleeding: None.  Movement: Present. Leaking Fluid denies.  ----------------------------------------------------------------------------------- The following portions of the patient's history were reviewed and updated as appropriate: allergies, current medications, past family history, past medical history, past social history, past surgical history and problem list. Problem list updated.  Objective  Blood pressure 118/76, weight 135 lb (61.2 kg), last menstrual period 04/06/2019. Pregravid weight 115 lb (52.2 kg) Total Weight Gain 20 lb (9.072 kg) Urinalysis: Urine Protein    Urine Glucose    Fetal Status: Fetal Heart Rate (bpm): 140 Fundal Height: 36 cm Movement: Present  Presentation: Vertex  General:  Alert, oriented and cooperative. Patient is in no acute distress.  Skin: Skin is warm and dry. No rash noted.   Cardiovascular: Normal heart rate noted  Respiratory: Normal respiratory effort, no problems with respiration noted  Abdomen: Soft, gravid, appropriate for gestational age. Pain/Pressure: Absent     Pelvic:  Cervical exam deferred        Extremities: Normal range of motion.     Mental Status: Normal mood and affect. Normal behavior. Normal judgment and thought content.   Assessment   34 y.o. 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, See Final Results (12/01 1227)  Anatomic [place] Incomplete 08/17/19; Complete 09/03/19 Rubella: 8.78 (12/01 1227)  Varicella: Immune  GTT Early: 112       Third trimester:  RPR: Non Reactive (12/01 1227)   Rhogam Per protocol 10/15/19 HBsAg: Negative (12/01 1227)   TDaP vaccine  [ ]  28-32 weeks                      Flu Shot: 06/19/2019 HIV: Non Reactive (12/01 1227)   Baby Food                                GBS:   Contraception  Pap: 06/19/2019 NIL HPV negative  CBB     CS/VBAC    Support Person Hubby           History of gestational diabetes 06/19/2019 by Will Bonnet, MD No   Overview Addendum 08/17/2019  1:12 PM by Will Bonnet, MD    [x]  early 1h gtt = 112      Rh negative state in antepartum period 06/19/2019 by Will Bonnet, MD No   Overview Signed 06/19/2019 10:58 AM by Will Bonnet, MD    [ ]  rhogam per protocol  Preterm labor symptoms and general obstetric precautions including but not limited to vaginal bleeding, contractions, leaking of fluid and fetal movement were reviewed in detail with the patient. Please refer to After Visit Summary for other counseling recommendations.   - GBS/Aptima today - concern for intrahepatic cholestasis of pregnancy. Will get bile acids and growth u/s.    Return in about 1 week (around 12/21/2019) for Growth u/s when possible and Routine Prenatal Appointment SDJ.  Thomasene Mohair, MD, [facility], [facility] 12/14/2019 10:04 AM

## 2019-12-14 NOTE — Addendum Note (Signed)
Addended by: Thomasene Mohair D on: 12/14/2019 01:55 PM   Modules accepted: Orders

## 2019-12-16 LAB — STREP GP B NAA: Strep Gp B NAA: NEGATIVE

## 2019-12-16 LAB — BILE ACIDS, TOTAL: Bile Acids Total: 3.2 umol/L (ref 0.0–10.0)

## 2019-12-19 LAB — CERVICOVAGINAL ANCILLARY ONLY
Chlamydia: NEGATIVE
Comment: NEGATIVE
Comment: NORMAL
Neisseria Gonorrhea: NEGATIVE

## 2019-12-24 ENCOUNTER — Ambulatory Visit (INDEPENDENT_AMBULATORY_CARE_PROVIDER_SITE_OTHER): Payer: BC Managed Care – PPO

## 2019-12-24 ENCOUNTER — Ambulatory Visit (INDEPENDENT_AMBULATORY_CARE_PROVIDER_SITE_OTHER): Payer: BC Managed Care – PPO | Admitting: Obstetrics and Gynecology

## 2019-12-24 ENCOUNTER — Encounter: Payer: Self-pay | Admitting: Obstetrics and Gynecology

## 2019-12-24 ENCOUNTER — Other Ambulatory Visit: Payer: Self-pay

## 2019-12-24 VITALS — BP 122/74 | Wt 138.0 lb

## 2019-12-24 DIAGNOSIS — Z8632 Personal history of gestational diabetes: Secondary | ICD-10-CM

## 2019-12-24 DIAGNOSIS — Z3A37 37 weeks gestation of pregnancy: Secondary | ICD-10-CM

## 2019-12-24 DIAGNOSIS — O0990 Supervision of high risk pregnancy, unspecified, unspecified trimester: Secondary | ICD-10-CM | POA: Diagnosis not present

## 2019-12-24 DIAGNOSIS — L299 Pruritus, unspecified: Secondary | ICD-10-CM

## 2019-12-24 DIAGNOSIS — Z3A36 36 weeks gestation of pregnancy: Secondary | ICD-10-CM

## 2019-12-24 DIAGNOSIS — O26899 Other specified pregnancy related conditions, unspecified trimester: Secondary | ICD-10-CM

## 2019-12-24 DIAGNOSIS — Z6791 Unspecified blood type, Rh negative: Secondary | ICD-10-CM

## 2019-12-24 NOTE — Progress Notes (Signed)
Routine Prenatal Care Visit  Subjective  Candice Hoover is a 34 y.o. G3P1011 at [redacted]w[redacted]d being seen today for ongoing prenatal care.  She is currently monitored for the following issues for this low-risk pregnancy and has Supervision of high risk pregnancy, antepartum; History of gestational diabetes; Rh negative state in antepartum period; Abdominal pain affecting pregnancy; and Itching on their problem list.  ----------------------------------------------------------------------------------- Patient reports continued itching.   Contractions: Not present. Vag. Bleeding: None.  Movement: (!) Decreased. Leaking Fluid denies.  Growth u/s today normal (AC > 97th %ile), AFI nml ----------------------------------------------------------------------------------- The following portions of the patient's history were reviewed and updated as appropriate: allergies, current medications, past family history, past medical history, past social history, past surgical history and problem list. Problem list updated.  Objective  Blood pressure 122/74, weight 138 lb (62.6 kg), last menstrual period 04/06/2019. Pregravid weight 115 lb (52.2 kg) Total Weight Gain 23 lb (10.4 kg) Urinalysis: Urine Protein    Urine Glucose    Fetal Status: Fetal Heart Rate (bpm): 145   Movement: (!) Decreased  Presentation: Vertex  General:  Alert, oriented and cooperative. Patient is in no acute distress.  Skin: Skin is warm and dry. No rash noted.   Cardiovascular: Normal heart rate noted  Respiratory: Normal respiratory effort, no problems with respiration noted  Abdomen: Soft, gravid, appropriate for gestational age. Pain/Pressure: Absent     Pelvic:  Cervical exam deferred        Extremities: Normal range of motion.  Edema: None  Mental Status: Normal mood and affect. Normal behavior. Normal judgment and thought content.   NST: Baseline FHR: 145 beats/min Variability: moderate Accelerations: present Decelerations:  absent Tocometry: not done  Interpretation:  INDICATIONS: decreased fetal movement RESULTS:  A NST procedure was performed with FHR monitoring and a normal baseline established, appropriate time of 20-40 minutes of evaluation, and accels >2 seen w 15x15 characteristics.  Results show a REACTIVE NST.    Imaging Results US OB Follow Up  Result Date: 12/24/2019 Patient Name: Candice Hoover DOB: May 29, 1986 MRN: [id number] ULTRASOUND REPORT Location: [facility] Date of Service: 12/24/2019 Indications:growth/afi Findings: Mason Jim intrauterine pregnancy is visualized with FHR at 144 BPM. Biometrics give an (U/S) Gestational age of [redacted]w[redacted]d and an (U/S) EDD of 01/05/2020; this correlates with the clinically established Estimated Date of Delivery: 01/11/20. Fetal presentation is Cephalic. Placenta: anterior. Grade: 2 AFI: 8.1 cm Growth percentile is 81.1%.  AC percentile is 97.5%. EFW: 3,572 g  (7 lb 14 oz) Impression: 1. [redacted]w[redacted]d Viable Singleton Intrauterine pregnancy previously established criteria. 2. Growth is 81.1 %ile.  AFI is 8.1 cm. Deanna Artis, RT The ultrasound images and findings were reviewed by me and I agree with the above report. Thomasene Mohair, MD, [facility], [facility] 12/24/2019 3:17 PM       Assessment   34 y.o. 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, See Final Results (12/01 1227)  Anatomic [place] Incomplete 08/17/19; Complete 09/03/19 Rubella: 8.78 (12/01 1227)  Varicella:  Immune  GTT  Early: 112       Third trimester:  RPR: Non Reactive (12/01 1227)   Rhogam Per protocol 10/15/19 HBsAg: Negative (12/01 1227)   TDaP vaccine  [ ]  28-32 weeks                      Flu Shot: 06/19/2019 HIV: Non Reactive (12/01 1227)   Baby Food                                GBS:   Contraception  Pap: 06/19/2019 NIL HPV negative  CBB     CS/VBAC    Support Person Hubby           History of gestational diabetes 06/19/2019 by Will Bonnet, MD No   Overview Addendum 08/17/2019  1:12 PM by Will Bonnet, MD    [x]  early 1h gtt = 112      Rh negative state in antepartum period 06/19/2019 by Will Bonnet, MD No   Overview Signed 06/19/2019 10:58 AM by Will Bonnet, MD    [ ]  rhogam per protocol          Term labor symptoms and general obstetric precautions including but not limited to vaginal bleeding, contractions, leaking of fluid and fetal movement were reviewed in detail with the patient. Please refer to After Visit Summary for other counseling recommendations.   - repeat bile acids.  Return in about 1 week (around 12/31/2019) for Routine Prenatal Appointment/NST.  Prentice Docker, MD, [facility], [facility] 12/24/2019 4:07 PM

## 2019-12-25 ENCOUNTER — Encounter: Payer: BC Managed Care – PPO | Admitting: Obstetrics and Gynecology

## 2019-12-27 ENCOUNTER — Other Ambulatory Visit: Payer: BC Managed Care – PPO

## 2019-12-27 ENCOUNTER — Other Ambulatory Visit: Payer: Self-pay

## 2019-12-27 DIAGNOSIS — O0990 Supervision of high risk pregnancy, unspecified, unspecified trimester: Secondary | ICD-10-CM

## 2019-12-27 DIAGNOSIS — L299 Pruritus, unspecified: Secondary | ICD-10-CM

## 2019-12-28 LAB — BILE ACIDS, TOTAL: Bile Acids Total: 5 umol/L (ref 0.0–10.0)

## 2019-12-31 ENCOUNTER — Ambulatory Visit (INDEPENDENT_AMBULATORY_CARE_PROVIDER_SITE_OTHER): Payer: BC Managed Care – PPO | Admitting: Obstetrics and Gynecology

## 2019-12-31 ENCOUNTER — Encounter: Payer: Self-pay | Admitting: Obstetrics and Gynecology

## 2019-12-31 ENCOUNTER — Other Ambulatory Visit: Payer: Self-pay

## 2019-12-31 VITALS — BP 100/60 | Wt 138.0 lb

## 2019-12-31 DIAGNOSIS — O26893 Other specified pregnancy related conditions, third trimester: Secondary | ICD-10-CM

## 2019-12-31 DIAGNOSIS — Z3A38 38 weeks gestation of pregnancy: Secondary | ICD-10-CM | POA: Diagnosis not present

## 2019-12-31 DIAGNOSIS — O0990 Supervision of high risk pregnancy, unspecified, unspecified trimester: Secondary | ICD-10-CM

## 2019-12-31 DIAGNOSIS — O0993 Supervision of high risk pregnancy, unspecified, third trimester: Secondary | ICD-10-CM

## 2019-12-31 DIAGNOSIS — Z8632 Personal history of gestational diabetes: Secondary | ICD-10-CM

## 2019-12-31 DIAGNOSIS — L299 Pruritus, unspecified: Secondary | ICD-10-CM | POA: Diagnosis not present

## 2019-12-31 DIAGNOSIS — Z6791 Unspecified blood type, Rh negative: Secondary | ICD-10-CM

## 2019-12-31 DIAGNOSIS — O26899 Other specified pregnancy related conditions, unspecified trimester: Secondary | ICD-10-CM

## 2019-12-31 LAB — POCT URINALYSIS DIPSTICK OB
Glucose, UA: NEGATIVE
POC,PROTEIN,UA: NEGATIVE

## 2019-12-31 NOTE — Progress Notes (Signed)
Routine Prenatal Care Visit  Subjective  Candice Hoover is a 34 y.o. G3P1011 at [redacted]w[redacted]d being seen today for ongoing prenatal care.  She is currently monitored for the following issues for this high-risk pregnancy and has Supervision of high risk pregnancy, antepartum; History of gestational diabetes; Rh negative state in antepartum period; Abdominal pain affecting pregnancy; and Itching on their problem list.  ----------------------------------------------------------------------------------- Patient reports no complaints.   Contractions: Irregular. Vag. Bleeding: None.  Movement: Present. Leaking Fluid denies.  ----------------------------------------------------------------------------------- The following portions of the patient's history were reviewed and updated as appropriate: allergies, current medications, past family history, past medical history, past social history, past surgical history and problem list. Problem list updated.  Objective  Blood pressure 100/60, weight 138 lb (62.6 kg), last menstrual period 04/06/2019. Pregravid weight 115 lb (52.2 kg) Total Weight Gain 23 lb (10.4 kg) Urinalysis: Urine Protein Negative  Urine Glucose Negative  Fetal Status: Fetal Heart Rate (bpm): 135   Movement: Present  Presentation: Vertex  General:  Alert, oriented and cooperative. Patient is in no acute distress.  Skin: Skin is warm and dry. No rash noted.   Cardiovascular: Normal heart rate noted  Respiratory: Normal respiratory effort, no problems with respiration noted  Abdomen: Soft, gravid, appropriate for gestational age. Pain/Pressure: Absent     Pelvic:  Cervical exam performed Dilation: 2 Effacement (%): 50 Station: -3  Extremities: Normal range of motion.     Mental Status: Normal mood and affect. Normal behavior. Normal judgment and thought content.   NST: Baseline FHR: 140 beats/min Variability: moderate Accelerations: present Decelerations: absent Tocometry: not  done  Interpretation:  INDICATIONS: pruritis RESULTS:  A NST procedure was performed with FHR monitoring and a normal baseline established, appropriate time of 20-40 minutes of evaluation, and accels >2 seen w 15x15 characteristics.  Results show a REACTIVE NST.    Assessment   34 y.o. 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, See Final Results (12/01 1227)  Anatomic [place] Incomplete 08/17/19; Complete 09/03/19 Rubella: 8.78 (12/01 1227)  Varicella: Immune  GTT Early: 112       Third trimester:  RPR: Non Reactive (12/01 1227)   Rhogam Per protocol 10/15/19 HBsAg: Negative (12/01 1227)   TDaP vaccine  [ ]  28-32 weeks                      Flu Shot: 06/19/2019 HIV: Non Reactive (12/01 1227)   Baby Food                                GBS:   Contraception  Pap: 06/19/2019 NIL HPV negative  CBB     CS/VBAC    Support Person Hubby           Previous Version   History of gestational diabetes 06/19/2019 by 14/07/2018, MD No   Overview Addendum 08/17/2019  1:12 PM by 08/19/2019, MD    [x]   early 1h gtt = 112      Previous Version   Rh negative state in antepartum period 06/19/2019 by Will Bonnet, MD No   Overview Signed 06/19/2019 10:58 AM by Will Bonnet, MD    [ ]  rhogam per protocol          Term labor symptoms and general obstetric precautions including but not limited to vaginal bleeding, contractions, leaking of fluid and fetal movement were reviewed in detail with the patient. Please refer  to After Visit Summary for other counseling recommendations.   - IOL on 6/24 at 0800. Form faxed to L&D  Return in about 1 week (around 01/07/2020) for Routine Prenatal Appointment/ NST.  Prentice Docker, MD, [facility], [facility] 12/31/2019 6:06 PM

## 2020-01-07 ENCOUNTER — Encounter: Payer: Self-pay | Admitting: Obstetrics and Gynecology

## 2020-01-07 ENCOUNTER — Inpatient Hospital Stay
Admission: AD | Admit: 2020-01-07 | Discharge: 2020-01-08 | DRG: 806 | Disposition: A | Discharge status: Home / Self Care | Payer: BC Managed Care – PPO | Attending: Certified Nurse Midwife | Admitting: Certified Nurse Midwife

## 2020-01-07 ENCOUNTER — Other Ambulatory Visit: Payer: Self-pay

## 2020-01-07 ENCOUNTER — Encounter: Payer: BC Managed Care – PPO | Admitting: Obstetrics and Gynecology

## 2020-01-07 DIAGNOSIS — Z6791 Unspecified blood type, Rh negative: Secondary | ICD-10-CM

## 2020-01-07 DIAGNOSIS — O26899 Other specified pregnancy related conditions, unspecified trimester: Secondary | ICD-10-CM | POA: Diagnosis not present

## 2020-01-07 DIAGNOSIS — O26893 Other specified pregnancy related conditions, third trimester: Secondary | ICD-10-CM | POA: Diagnosis present

## 2020-01-07 DIAGNOSIS — Z3A39 39 weeks gestation of pregnancy: Secondary | ICD-10-CM | POA: Diagnosis not present

## 2020-01-07 DIAGNOSIS — D62 Acute posthemorrhagic anemia: Secondary | ICD-10-CM | POA: Diagnosis not present

## 2020-01-07 DIAGNOSIS — O0990 Supervision of high risk pregnancy, unspecified, unspecified trimester: Secondary | ICD-10-CM

## 2020-01-07 DIAGNOSIS — O9081 Anemia of the puerperium: Secondary | ICD-10-CM | POA: Diagnosis not present

## 2020-01-07 DIAGNOSIS — Z20822 Contact with and (suspected) exposure to covid-19: Secondary | ICD-10-CM | POA: Diagnosis present

## 2020-01-07 DIAGNOSIS — L299 Pruritus, unspecified: Secondary | ICD-10-CM

## 2020-01-07 DIAGNOSIS — Z8632 Personal history of gestational diabetes: Secondary | ICD-10-CM

## 2020-01-07 LAB — CBC
HCT: 35.3 % — ABNORMAL LOW (ref 36.0–46.0)
Hemoglobin: 11.6 g/dL — ABNORMAL LOW (ref 12.0–15.0)
MCH: 25.7 pg — ABNORMAL LOW (ref 26.0–34.0)
MCHC: 32.9 g/dL (ref 30.0–36.0)
MCV: 78.1 fL — ABNORMAL LOW (ref 80.0–100.0)
Platelets: 306 10*3/uL (ref 150–400)
RBC: 4.52 MIL/uL (ref 3.87–5.11)
RDW: 15.5 % (ref 11.5–15.5)
WBC: 15.9 10*3/uL — ABNORMAL HIGH (ref 4.0–10.5)
nRBC: 0.2 % (ref 0.0–0.2)

## 2020-01-07 LAB — SARS CORONAVIRUS 2 BY RT PCR (HOSPITAL ORDER, PERFORMED IN ~~LOC~~ HOSPITAL LAB): SARS Coronavirus 2: NEGATIVE

## 2020-01-07 LAB — TYPE AND SCREEN
ABO/RH(D): B NEG
Antibody Screen: POSITIVE

## 2020-01-07 LAB — ABO/RH: ABO/RH(D): B NEG

## 2020-01-07 MED ORDER — LIDOCAINE HCL (PF) 1 % IJ SOLN
INTRAMUSCULAR | Status: AC
  Filled 2020-01-07: qty 30

## 2020-01-07 MED ORDER — OXYTOCIN 10 UNIT/ML IJ SOLN
INTRAMUSCULAR | Status: AC
  Filled 2020-01-07: qty 2

## 2020-01-07 MED ORDER — ONDANSETRON HCL 4 MG/2ML IJ SOLN: 4.0000 mg | INTRAMUSCULAR | Status: DC | PRN

## 2020-01-07 MED ORDER — LIDOCAINE HCL (PF) 1 % IJ SOLN: 30.0000 mL | INTRAMUSCULAR | Status: DC | PRN

## 2020-01-07 MED ORDER — MISOPROSTOL 200 MCG PO TABS
ORAL_TABLET | ORAL | Status: AC
  Filled 2020-01-07: qty 4

## 2020-01-07 MED ORDER — OXYTOCIN-SODIUM CHLORIDE 30-0.9 UT/500ML-% IV SOLN
INTRAVENOUS | Status: AC
  Administered 2020-01-07: 333 mL via INTRAVENOUS
  Filled 2020-01-07: qty 500

## 2020-01-07 MED ORDER — SIMETHICONE 80 MG PO CHEW: 80.0000 mg | CHEWABLE_TABLET | ORAL | Status: DC | PRN

## 2020-01-07 MED ORDER — AMMONIA AROMATIC IN INHA: 0.3000 mL | Freq: Once | RESPIRATORY_TRACT | Status: DC | PRN

## 2020-01-07 MED ORDER — OXYTOCIN BOLUS FROM INFUSION: 333.0000 mL | Freq: Once | INTRAVENOUS | Status: AC

## 2020-01-07 MED ORDER — IBUPROFEN 600 MG PO TABS
600.0000 mg | ORAL_TABLET | Freq: Four times a day (QID) | ORAL | Status: DC
  Administered 2020-01-07 (×2): 600 mg via ORAL
  Filled 2020-01-07 (×6): qty 1

## 2020-01-07 MED ORDER — PRENATAL MULTIVITAMIN CH
1.0000 | ORAL_TABLET | Freq: Every day | ORAL | Status: DC
  Administered 2020-01-07 – 2020-01-08 (×2): 1 via ORAL
  Filled 2020-01-07 (×2): qty 1

## 2020-01-07 MED ORDER — BENZOCAINE-MENTHOL 20-0.5 % EX AERO
INHALATION_SPRAY | CUTANEOUS | Status: AC
  Administered 2020-01-07: 1 via TOPICAL
  Filled 2020-01-07: qty 56

## 2020-01-07 MED ORDER — FENTANYL CITRATE (PF) 100 MCG/2ML IJ SOLN: 50.0000 ug | INTRAMUSCULAR | Status: DC | PRN

## 2020-01-07 MED ORDER — LACTATED RINGERS IV SOLN: INTRAVENOUS | Status: DC

## 2020-01-07 MED ORDER — SENNOSIDES-DOCUSATE SODIUM 8.6-50 MG PO TABS
2.0000 | ORAL_TABLET | ORAL | Status: DC
  Administered 2020-01-07: 2 via ORAL
  Filled 2020-01-07: qty 2

## 2020-01-07 MED ORDER — WITCH HAZEL-GLYCERIN EX PADS: 1.0000 "application " | MEDICATED_PAD | CUTANEOUS | Status: DC | PRN

## 2020-01-07 MED ORDER — OXYCODONE HCL 5 MG PO TABS: 5.0000 mg | ORAL_TABLET | ORAL | Status: DC | PRN

## 2020-01-07 MED ORDER — ACETAMINOPHEN 325 MG PO TABS: 650.0000 mg | ORAL_TABLET | ORAL | Status: DC | PRN

## 2020-01-07 MED ORDER — FERROUS SULFATE 325 (65 FE) MG PO TABS
325.0000 mg | ORAL_TABLET | Freq: Every day | ORAL | Status: DC
  Administered 2020-01-07 – 2020-01-08 (×2): 325 mg via ORAL
  Filled 2020-01-07 (×2): qty 1

## 2020-01-07 MED ORDER — LACTATED RINGERS IV SOLN: 500.0000 mL | INTRAVENOUS | Status: DC | PRN

## 2020-01-07 MED ORDER — MISOPROSTOL 200 MCG PO TABS: 800.0000 ug | ORAL_TABLET | Freq: Once | ORAL | Status: DC | PRN

## 2020-01-07 MED ORDER — BENZOCAINE-MENTHOL 20-0.5 % EX AERO: 1.0000 "application " | INHALATION_SPRAY | CUTANEOUS | Status: DC | PRN

## 2020-01-07 MED ORDER — DIBUCAINE (PERIANAL) 1 % EX OINT: 1.0000 "application " | TOPICAL_OINTMENT | CUTANEOUS | Status: DC | PRN

## 2020-01-07 MED ORDER — OXYTOCIN-SODIUM CHLORIDE 30-0.9 UT/500ML-% IV SOLN
2.5000 [IU]/h | INTRAVENOUS | Status: DC
  Administered 2020-01-07: 2.5 [IU]/h via INTRAVENOUS
  Filled 2020-01-07: qty 500

## 2020-01-07 MED ORDER — COCONUT OIL OIL: 1.0000 "application " | TOPICAL_OIL | Status: DC | PRN

## 2020-01-07 MED ORDER — ONDANSETRON HCL 4 MG/2ML IJ SOLN: 4.0000 mg | Freq: Four times a day (QID) | INTRAMUSCULAR | Status: DC | PRN

## 2020-01-07 MED ORDER — AMMONIA AROMATIC IN INHA
RESPIRATORY_TRACT | Status: AC
  Filled 2020-01-07: qty 10

## 2020-01-07 MED ORDER — ONDANSETRON HCL 4 MG PO TABS: 4.0000 mg | ORAL_TABLET | ORAL | Status: DC | PRN

## 2020-01-07 MED ORDER — IBUPROFEN 600 MG PO TABS
ORAL_TABLET | ORAL | Status: AC
  Administered 2020-01-07: 600 mg via ORAL
  Filled 2020-01-07: qty 1

## 2020-01-07 NOTE — Lactation Note (Signed)
This note was copied from a baby's chart. Lactation Consultation Note  Patient Name: Candice Hoover [id number] Date: 01/07/2020   Observed mom breast feeding [name].  Demonstrated how to hand express colostrum.  [name] latched with minimal assistance and began strong, rhythmic sucking with occasional swallow.  Mom denies any breast or nipple pain.  Mom breast fed her first baby for 3 months reporting it just became to hard when she had to return to work.  Mom reports her first baby was really fussy in the beginning.  Parents had questions which were addressed about how to know she was getting enough milk, how to prevent colic, when and how to begin pumping, etc.  Reviewed normal newborn stomach size, adequate intake and output the first week, growth spurts, supply and demand, normal course of lactation and routine newborn feeding patterns.  Lactation name and number written on white board and encouraged to call with any questions, concerns or assistance.  Lactation Government social research officer given and reviewed.     Maternal Data    Feeding Feeding Type: Breast Fed  LATCH Score                   Interventions    Lactation Tools Discussed/Used     Consult Status      Louis Meckel 01/07/2020, 7:07 PM

## 2020-01-07 NOTE — Progress Notes (Signed)
Pt requesting iv to be removed; pt's assessment is WNL and VS are WNL; there is an order that says iv can be discontinued or saline locked; RN explained the medication Rhogam to pt; pt ok with IM injection for Rhogam and iv out

## 2020-01-07 NOTE — Discharge Summary (Signed)
Physician Obstetric Discharge Summary  Patient ID: Candice Hoover MRN: [id number] DOB/AGE: 08/09/1985 34 y.o.   Date of Admission: 01/07/2020 Delivering Provider: Farrel Conners, CNM Date of Discharge: 01/08/2020  Admitting Diagnosis: Onset of Labor at [redacted]w[redacted]d  Secondary Diagnosis: RH negative status  Mode of Delivery: normal spontaneous vaginal delivery 01/07/2020      Discharge Diagnosis: Term intrauterine pregnancy-delivered at 39.3 weeks, meconium stained amniotic fluid, precipitous labor   Intrapartum Procedures: Atificial rupture of membranes   Post partum procedures: Rhogam  Complications: none   Brief Hospital Course  Candice Hoover is a [id number] who had a SVD on 01/07/2020;  for further details of this delivery, please refer to the delivery note.  Patient had an uncomplicated postpartum course.  By time of discharge on PPD#1, her pain was controlled on oral pain medications; she had appropriate lochia and was ambulating, voiding without difficulty and tolerating regular diet.  She was deemed stable for discharge to home.    Labs: CBC Latest Ref Rng & Units 01/07/2020 11/11/2019 10/15/2019  WBC 4.0 - 10.5 K/uL 15.9(H) 17.2(H) 12.5(H)  Hemoglobin 12.0 - 15.0 g/dL 11.6(L) 11.8(L) 10.9(L)  Hematocrit 36 - 46 % 35.3(L) 35.5(L) 32.4(L)  Platelets 150 - 400 K/uL 306 222 252   B negative/ RI/ VI  Physical exam:  Blood pressure 128/86, pulse 77, temperature 97.6 F (36.4 C), temperature source Oral, resp. rate 16, height 5\' 4"  (1.626 m), weight 63.5 kg, last menstrual period 04/06/2019. General: alert and no distress Lochia: appropriate Abdomen: soft, NT Uterine Fundus: firm  no Incision: healing well, no significant drainage, no dehiscence, no significant erythema Extremities: No evidence of DVT seen on physical exam. No lower extremity edema.  Discharge Instructions: Per After Visit Summary. Activity: Advance as tolerated. Pelvic rest for 6 weeks.  Also refer to  Discharge Instructions Diet: Regular Medications: Allergies as of 01/07/2020   No Known Allergies         Outpatient follow up:   Follow-up Information    01/09/2020, CNM. Schedule an appointment as soon as possible for a visit.   Specialty: Certified Nurse Midwife Why: for a 6 week postpartum check up Contact information: [address] [place] [phone]              Postpartum contraception: IUD? Discussed at length prior to discharge  Discharged Condition: good  Discharged to: home   Newborn Data: Disposition:home with mother  Apgars: APGAR (1 MIN): 7   APGAR (5 MINS): 8   APGAR (10 MINS):    Baby Feeding: Breast  [phone], CNM  01/08/2020 12:43 PM

## 2020-01-07 NOTE — H&P (Signed)
OB History & Physical   History of Present Illness:  Chief Complaint:  Complains of painful contractions since 0300 this AM. HPI:  Candice Hoover is a 34 y.o. G33P1011 female with EDC=01/11/2020 at [redacted]w[redacted]d dated by LMP=6wk5d [place]. 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 Her bile acids were monitored but never went above 9.1 and her last bile acid on 6/10 was 5. Antepartum testing was reassuring. Itching was treated with Atarax with some relief.  She presents to L&D for evaluation of labor She started contracting painfully at 0300 this AM and upon arrival was 6-7 cm with a BBOW. Denies vaginal bleeding. Baby active.   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, See Final Results (12/01 1227)  Anatomic [place] Incomplete 08/17/19; Complete 09/03/19 Rubella: 8.78 (12/01 1227)  Varicella: Immune  GTT Early: 112       Third trimester: 103  RPR: Non Reactive (12/01 1227)   Rhogam Per protocol 10/15/19 HBsAg: Negative (12/01 1227)   TDaP vaccine  [ ]  28-32 weeks                      Flu Shot: 06/19/2019 HIV: Non Reactive (12/01 1227)   Baby Food          Breast                      GBS: negative  Contraception  Pap: 06/19/2019 NIL HPV negative  CBB     CS/VBAC    Support Person Hubby         Maternal Medical History:   Past Medical History:  Diagnosis Date   Gestational diabetes    with first baby   Rh negative state in antepartum period    Varicella     Past Surgical History:  Procedure Laterality Date   NO PAST SURGERIES      No Known Allergies  Prior to Admission medications   Medication Sig Start Date End Date Taking? Authorizing  Provider  hydrOXYzine (ATARAX/VISTARIL) 25 MG tablet Take 1 tablet (25 mg total) by mouth every 6 (six) hours as needed for itching. 11/26/19   Will Bonnet, MD  Prenatal Vit-Fe Fumarate-FA (PRENATAL VITAMIN PO) Take by mouth.    [provider]          Social History: She  reports that she has never smoked. She has never used smokeless tobacco. She reports previous alcohol use. She reports that she does not use drugs.  Family History: family history includes Thyroid disease in her sister.   Review of Systems: Negative x 10 systems reviewed except as noted in the HPI.      Physical Exam:  Vital Signs: BP 115/77   Pulse 86   Temp 97.6 F (36.4 C) (Oral)   Resp 16   Ht 5\' 4"  (1.626 m)   Wt 63.5 kg   LMP 04/06/2019   BMI 24.03 kg/m  General: crying out with pain during contractions HEENT: normocephalic, atraumatic Heart: regular rate & rhythm.  No murmurs/rubs/gallops Lungs: clear to auscultation bilaterally Abdomen: soft, gravid, non-tender;  EFW: 7 1/2# Pelvic:   External: Normal external female genitalia  Cervix: Dilation: Lip/rim / Effacement (%): 100 / Station: 0   Extremities: non-tender, symmetric, no edema bilaterally.  Neurologic: Alert & oriented x 3.   Baseline FHR: 125 baseline with accelerations to 150, moderate variability Toco: contractions every 2-3 minutes apart Bedside Ultrasound: confirmed cephalic presentation   Assessment:  Candice Hoover is a 34 y.o. G44P1011 female at [redacted]w[redacted]d with rapidly progressing labor.   Plan:  Admit to Labor & Delivery - anticipate vaginal delivery shortly CBC, T&S, Clrs, IVF GBS negative.   Consents obtained. Breast B negative/ RI/ VI-Rhogam candidate TDAP-last dose 2018 Contraception-? Pain relief in labor-will need to hold because delivery is imminent    Farrel Conners  01/07/2020 6:16 AM

## 2020-01-07 NOTE — OB Triage Note (Signed)
Pt is a 34y/o G3P1 at [redacted]w[redacted]d with c/o contractions q68min rating 10/10. Pt states +FM, but less today. Pt states contractions have been irregular since yesterday and became intense at 3am. Pt denies LOF, and VB. Monitors applied and assessing. Initial FHT 135

## 2020-01-07 NOTE — Progress Notes (Signed)
Obstetric Postpartum Daily Progress Note Subjective:  34 y.o. [id number] postpartum day #0 status post vaginal delivery.  She is ambulating, is tolerating po, is voiding spontaneously.  Her pain is well controlled on PO pain medications. Her lochia is less than menses.   Medications SCHEDULED MEDICATIONS  . ferrous sulfate  325 mg Oral Q breakfast  . ibuprofen  600 mg Oral Q6H  . prenatal multivitamin  1 tablet Oral Q1200  . [START ON 01/08/2020] senna-docusate  2 tablet Oral Q24H    MEDICATION INFUSIONS    PRN MEDICATIONS  acetaminophen, benzocaine-Menthol, coconut oil, witch hazel-glycerin **AND** dibucaine, ondansetron **OR** ondansetron (ZOFRAN) IV, oxyCODONE, simethicone    Objective:   Vitals:   01/07/20 0800 01/07/20 0830 01/07/20 0927 01/07/20 1107  BP: 117/73 119/74 124/76 118/69  Pulse: 70 71 70 72  Resp:   18 20  Temp:    98 F (36.7 C)  TempSrc:    Oral  SpO2:   99% 98%  Weight:      Height:        Current Vital Signs 24h Vital Sign Ranges  T 98 F (36.7 C) Temp  Avg: 97.8 F (36.6 C)  Min: 97.6 F (36.4 C)  Max: 98 F (36.7 C)  BP 118/69 BP  Min: 95/77  Max: 128/86  HR 72 Pulse  Avg: 78.5  Min: 70  Max: 103  RR 20 Resp  Avg: 18  Min: 16  Max: 20  SaO2 98 % Room Air SpO2  Avg: 98.5 %  Min: 98 %  Max: 99 %       24 Hour I/O Current Shift I/O  Time Ins Outs 06/20 0701 - 06/21 0700 In: -  Out: 100  06/21 0701 - 06/21 1900 In: 240 [P.O.:240] Out: 65   General: NAD Pulmonary: no increased work of breathing Abdomen: non-distended, non-tender, fundus firm at level of umbilicus Extremities: no edema, no erythema, no tenderness  Labs:  Recent Labs  Lab 01/07/20 0505  WBC 15.9*  HGB 11.6*  HCT 35.3*  PLT 306     Assessment:   34 y.o. [id number] postpartum day # 0 status post precipitous SVD  Plan:   1) Acute blood loss anemia - hemodynamically stable and asymptomatic - po ferrous sulfate  2) B NEG Performed at [facility], [address], [place], [place] [address]  /  Information for the patient's newborn:  Bryanda, Mikel [[id number]]  AB POS   / Rubella 8.78 (12/01 1227)/ Varicella Immune  3) TDAP status 11/16/2016  4) breast feeding/Contraception = unknown  5) Disposition: home PPD#1-2  Thomasene Mohair, MD 01/07/2020 1:14 PM

## 2020-01-08 LAB — CBC
HCT: 32.7 % — ABNORMAL LOW (ref 36.0–46.0)
Hemoglobin: 10.8 g/dL — ABNORMAL LOW (ref 12.0–15.0)
MCH: 25.7 pg — ABNORMAL LOW (ref 26.0–34.0)
MCHC: 33 g/dL (ref 30.0–36.0)
MCV: 77.7 fL — ABNORMAL LOW (ref 80.0–100.0)
Platelets: 273 10*3/uL (ref 150–400)
RBC: 4.21 MIL/uL (ref 3.87–5.11)
RDW: 15.5 % (ref 11.5–15.5)
WBC: 13.8 10*3/uL — ABNORMAL HIGH (ref 4.0–10.5)
nRBC: 0 % (ref 0.0–0.2)

## 2020-01-08 LAB — RPR: RPR Ser Ql: NONREACTIVE

## 2020-01-08 LAB — FETAL SCREEN: Fetal Screen: NEGATIVE

## 2020-01-08 MED ORDER — IBUPROFEN 600 MG PO TABS: 600.0000 mg | ORAL_TABLET | Freq: Four times a day (QID) | ORAL | 0 refills | Status: DC

## 2020-01-08 MED ORDER — RHO D IMMUNE GLOBULIN 1500 UNIT/2ML IJ SOSY
300.0000 ug | PREFILLED_SYRINGE | Freq: Once | INTRAMUSCULAR | Status: AC
  Administered 2020-01-08: 300 ug via INTRAMUSCULAR
  Filled 2020-01-08: qty 2

## 2020-01-08 NOTE — Progress Notes (Signed)
Pt discharged with infant.  Discharge instructions, prescriptions and follow up appointment given to and reviewed with pt. Pt verbalized understanding. Escorted out by auxillary. 

## 2020-01-08 NOTE — Discharge Instructions (Signed)
Intrauterine Device Insertion An intrauterine device (IUD) is a medical device that gets inserted into the uterus to prevent pregnancy. It is a small, T-shaped device that has one or two nylon strings hanging down from it. The strings hang out of the lower part of the uterus (cervix) to allow for future IUD removal. There are two types of IUDs available:  Copper IUD. This type of IUD has copper wire wrapped around it. Copper makes the uterus and fallopian tubes produce a fluid that kills sperm. A copper IUD may last up to 10 years.  Hormone IUD. This type of IUD is made of plastic and contains the hormone progestin (synthetic progesterone). The hormone thickens mucus in the cervix and prevents sperm from entering the uterus. It also thins the uterine lining to prevent implantation of a fertilized egg. The hormone can weaken or kill the sperm that get into the uterus. A hormone IUD may last 3-5 years. Tell a health care provider about:  Any allergies you have.  All medicines you are taking, including vitamins, herbs, eye drops, creams, and over-the-counter medicines.  Any problems you or family members have had with anesthetic medicines.  Any blood disorders you have.  Any surgeries you have had.  Any medical conditions you have, including any STIs (sexually transmitted infections) you may have.  Whether you are pregnant or may be pregnant. What are the risks? Generally, this is a safe procedure. However, problems may occur, including:  Infection.  Bleeding.  Allergic reactions to medicines.  Accidental puncture (perforation) of the uterus, or damage to other structures or organs.  Accidental placement of the IUD either in the muscle layer of the uterus (myometrium) or outside the uterus.  The IUD falling out of the uterus (expulsion). This is more common among women who have recently had a child.  Pregnancy that happens in the fallopian tube (ectopic pregnancy).  Infection of  the uterus and fallopian tubes (pelvic inflammatory disease). What happens before the procedure?  Schedule the IUD insertion for when you will have your menstrual period or right after, to make sure you are not pregnant. Placement of the IUD is better tolerated shortly after a menstrual cycle.  Follow instructions from your health care provider about eating or drinking restrictions.  Ask your health care provider about changing or stopping your regular medicines. This is especially important if you are taking diabetes medicines or blood thinners.  You may get a pain reliever to take before the procedure.  You may have tests for: ? Pregnancy. A pregnancy test involves having a urine sample taken. ? STIs. Placing an IUD in someone who has an STI can make the infection worse. ? Cervical cancer. You may have a Pap test to check for this type of cancer. This means collecting cells from your cervix to be examined under a microscope.  You may have a physical exam to determine the size and position of your uterus. The procedure may vary among health care providers and hospitals. What happens during the procedure?  A tool (speculum) will be placed in your vagina and widened so that your health care provider can see your cervix.  Medicine may be applied to your cervix to help lower your risk of infection (antiseptic medicine).  You may be given an anesthetic medicine to numb each side of your cervix (intracervical block or paracervical block). This medicine is usually given by an injection into the cervix.  A tool (uterine sound) will be inserted into your   uterus to determine the length of your uterus and the direction that your uterus may be tilted.  A slim instrument or tube (IUD inserter) that holds the IUD will be inserted into your vagina, through your cervical canal, and into your uterus.  The IUD will be placed in the uterus, and the IUD inserter will be removed.  The strings that are  attached to the IUD will be trimmed so that they lie just below the cervix. The procedure may vary among health care providers and hospitals. What happens after the procedure?  You may have bleeding after the procedure. This is normal. It varies from light bleeding (spotting) for a few days to menstrual-like bleeding.  You may have cramping and pain.  You may feel dizzy or light-headed.  You may have lower back pain. Summary  An intrauterine device (IUD) is a small, T-shaped device that has one or two nylon strings hanging down from it.  Two types of IUDs are available. You may have a copper IUD or a hormone IUD.  Schedule the IUD insertion for when you will have your menstrual period or right after, to make sure you are not pregnant. Placement of the IUD is better tolerated shortly after a menstrual cycle.  You may have bleeding after the procedure. This is normal. It varies from light spotting for a few days to menstrual-like bleeding. This information is not intended to replace advice given to you by your health care provider. Make sure you discuss any questions you have with your health care provider. Document Revised: 06/17/2017 Document Reviewed: 05/26/2016 Elsevier Patient Education  2020 [employer]. Contraception Choices Contraception, also called birth control, refers to methods or devices that prevent pregnancy. Hormonal methods Contraceptive implant  A contraceptive implant is a thin, plastic tube that contains a hormone. It is inserted into the upper part of the arm. It can remain in place for up to 3 years. Progestin-only injections Progestin-only injections are injections of progestin, a synthetic form of the hormone progesterone. They are given every 3 months by a health care provider. Birth control pills  Birth control pills are pills that contain hormones that prevent pregnancy. They must be taken once a day, preferably at the same time each day. Birth control  patch  The birth control patch contains hormones that prevent pregnancy. It is placed on the skin and must be changed once a week for three weeks and removed on the fourth week. A prescription is needed to use this method of contraception. Vaginal ring  A vaginal ring contains hormones that prevent pregnancy. It is placed in the vagina for three weeks and removed on the fourth week. After that, the process is repeated with a new ring. A prescription is needed to use this method of contraception. Emergency contraceptive Emergency contraceptives prevent pregnancy after unprotected sex. They come in pill form and can be taken up to 5 days after sex. They work best the sooner they are taken after having sex. Most emergency contraceptives are available without a prescription. This method should not be used as your only form of birth control. Barrier methods Female condom  A female condom is a thin sheath that is worn over the penis during sex. Condoms keep sperm from going inside a woman's body. They can be used with a spermicide to increase their effectiveness. They should be disposed after a single use. Female condom  A female condom is a soft, loose-fitting sheath that is put into the vagina before  sex. The condom keeps sperm from going inside a woman's body. They should be disposed after a single use. Diaphragm  A diaphragm is a soft, dome-shaped barrier. It is inserted into the vagina before sex, along with a spermicide. The diaphragm blocks sperm from entering the uterus, and the spermicide kills sperm. A diaphragm should be left in the vagina for 6-8 hours after sex and removed within 24 hours. A diaphragm is prescribed and fitted by a health care provider. A diaphragm should be replaced every 1-2 years, after giving birth, after gaining more than 15 lb (6.8 kg), and after pelvic surgery. Cervical cap  A cervical cap is a round, soft latex or plastic cup that fits over the cervix. It is inserted  into the vagina before sex, along with spermicide. It blocks sperm from entering the uterus. The cap should be left in place for 6-8 hours after sex and removed within 48 hours. A cervical cap must be prescribed and fitted by a health care provider. It should be replaced every 2 years. Sponge  A sponge is a soft, circular piece of polyurethane foam with spermicide on it. The sponge helps block sperm from entering the uterus, and the spermicide kills sperm. To use it, you make it wet and then insert it into the vagina. It should be inserted before sex, left in for at least 6 hours after sex, and removed and thrown away within 30 hours. Spermicides Spermicides are chemicals that kill or block sperm from entering the cervix and uterus. They can come as a cream, jelly, suppository, foam, or tablet. A spermicide should be inserted into the vagina with an applicator at least 81-44 minutes before sex to allow time for it to work. The process must be repeated every time you have sex. Spermicides do not require a prescription. Intrauterine contraception Intrauterine device (IUD) An IUD is a T-shaped device that is put in a woman's uterus. There are two types:  Hormone IUD.This type contains progestin, a synthetic form of the hormone progesterone. This type can stay in place for 3-5 years.  Copper IUD.This type is wrapped in copper wire. It can stay in place for 10 years.  Permanent methods of contraception Female tubal ligation In this method, a woman's fallopian tubes are sealed, tied, or blocked during surgery to prevent eggs from traveling to the uterus. Hysteroscopic sterilization In this method, a small, flexible insert is placed into each fallopian tube. The inserts cause scar tissue to form in the fallopian tubes and block them, so sperm cannot reach an egg. The procedure takes about 3 months to be effective. Another form of birth control must be used during those 3 months. Female sterilization This  is a procedure to tie off the tubes that carry sperm (vasectomy). After the procedure, the man can still ejaculate fluid (semen). Natural planning methods Natural family planning In this method, a couple does not have sex on days when the woman could become pregnant. Calendar method This means keeping track of the length of each menstrual cycle, identifying the days when pregnancy can happen, and not having sex on those days. Ovulation method In this method, a couple avoids sex during ovulation. Symptothermal method This method involves not having sex during ovulation. The woman typically checks for ovulation by watching changes in her temperature and in the consistency of cervical mucus. Post-ovulation method In this method, a couple waits to have sex until after ovulation. Summary  Contraception, also called birth control, means methods or  devices that prevent pregnancy.  Hormonal methods of contraception include implants, injections, pills, patches, vaginal rings, and emergency contraceptives.  Barrier methods of contraception can include female condoms, female condoms, diaphragms, cervical caps, sponges, and spermicides.  There are two types of IUDs (intrauterine devices). An IUD can be put in a woman's uterus to prevent pregnancy for 3-5 years.  Permanent sterilization can be done through a procedure for males, females, or both.  Natural family planning methods involve not having sex on days when the woman could become pregnant. This information is not intended to replace advice given to you by your health care provider. Make sure you discuss any questions you have with your health care provider. Document Revised: 07/07/2017 Document Reviewed: 08/07/2016 Elsevier Patient Education  2020 [employer]  Vaginal Delivery, Care After Refer to this sheet in the next few weeks. These discharge instructions provide you with information on caring for yourself after delivery. Your caregiver  may also give you specific instructions. Your treatment has been planned according to the most current medical practices available, but problems sometimes occur. Call your caregiver if you have any problems or questions after you go home. HOME CARE INSTRUCTIONS 1. Take over-the-counter or prescription medicines only as directed by your caregiver or pharmacist. 2. Do not drink alcohol, especially if you are breastfeeding or taking medicine to relieve pain. 3. Do not smoke tobacco. 4. Continue to use good perineal care. Good perineal care includes: 1. Wiping your perineum from back to front 2. Keeping your perineum clean. 3. You can do sitz baths twice a day, to help keep this area clean 5. Do not use tampons, douche or have sex for 6 weeks 6. Shower only and avoid sitting in submerged water, aside from sitz baths 7. Wear a well-fitting bra that provides breast support. 8. Eat healthy foods. 9. Drink enough fluids to keep your urine clear or pale yellow. 10. Eat high-fiber foods such as whole grain cereals and breads, brown rice, beans, and fresh fruits and vegetables every day. These foods may help prevent or relieve constipation. 11. Avoid constipation with high fiber foods or medications, such as miralax or metamucil 12. Follow your caregiver's recommendations regarding resumption of activities such as climbing stairs, driving, lifting, exercising, or traveling. 13. Talk to your caregiver about resuming sexual activities. Resumption of sexual activities after 6 weeks is dependent upon your risk of infection, your rate of healing, and your comfort and desire to resume sexual activity. 14. Try to have someone help you with your household activities and your newborn for at least a few days after you leave the hospital. 15. Rest as much as possible. Try to rest or take a nap when your newborn is sleeping. 16. Increase your activities gradually. 17. Keep all of your scheduled postpartum  appointments. It is very important to keep your scheduled follow-up appointments. At these appointments, your caregiver will be checking to make sure that you are healing physically and emotionally. SEEK MEDICAL CARE IF:   You are passing large clots from your vagina. Save any clots to show your caregiver.  You have a foul smelling discharge from your vagina.  You have trouble urinating.  You are urinating frequently.  You have pain when you urinate.  You have a change in your bowel movements.  You have increasing redness, pain, or swelling near your vaginal incision (episiotomy) or vaginal tear.  You have pus draining from your episiotomy or vaginal tear.  Your episiotomy or vaginal tear is  separating.  You have painful, hard, or reddened breasts.  You have a severe headache.  You have blurred vision or see spots.  You feel sad or depressed.  You have thoughts of hurting yourself or your newborn.  You have questions about your care, the care of your newborn, or medicines.  You are dizzy or light-headed.  You have a rash.  You have nausea or vomiting.  You were breastfeeding and have not had a menstrual period within 12 weeks after you stopped breastfeeding.  You are not breastfeeding and have not had a menstrual period by the 12th week after delivery.  You have a fever of 100.5 or more SEEK IMMEDIATE MEDICAL CARE IF:   You have persistent pain.  You have chest pain.  You have shortness of breath.  You faint.  You have leg pain.  You have stomach pain.  Your vaginal bleeding saturates two or more sanitary pads in 1 hour. MAKE SURE YOU:   Understand these instructions.  Will watch your condition.  Will get help right away if you are not doing well or get worse. Document Released: 07/02/2000 Document Revised: 11/19/2013 Document Reviewed: 03/01/2012 [facility] Patient Information 2015 [place], [place]. This information is not intended to replace advice  given to you by your health care provider. Make sure you discuss any questions you have with your health care provider.  Sitz Bath A sitz bath is a warm water bath taken in the sitting position. The water covers only the hips and butt (buttocks). We recommend using one that fits in the toilet, to help with ease of use and cleanliness. It may be used for either healing or cleaning purposes. Sitz baths are also used to relieve pain, itching, or muscle tightening (spasms). The water may contain medicine. Moist heat will help you heal and relax.  HOME CARE  Take 3 to 4 sitz baths a day. 18. Fill the bathtub half-full with warm water. 19. Sit in the water and open the drain a little. 20. Turn on the warm water to keep the tub half-full. Keep the water running constantly. 21. Soak in the water for 15 to 20 minutes. 22. After the sitz bath, pat the affected area dry. GET HELP RIGHT AWAY IF: You get worse instead of better. Stop the sitz baths if you get worse. MAKE SURE YOU:  Understand these instructions.  Will watch your condition.  Will get help right away if you are not doing well or get worse. Document Released: 08/12/2004 Document Revised: 03/29/2012 Document Reviewed: 11/02/2010 [facility] Patient Information 2015 [place], [place]. This information is not intended to replace advice given to you by your health care provider. Make sure you discuss any questions you have with your health care provider.

## 2020-01-08 NOTE — Lactation Note (Signed)
This note was copied from a baby's chart. Lactation Consultation Note  Patient Name: [name] [id number] Date: 01/08/2020 Reason for consult: Follow-up assessment  LC follow-up before discharge. Baby actively BF when LC entered the room, parents reporting "baby feeding all the time". LC reassured parents with education on cluster feeding, newborn stomach size, and output to indicate that baby is getting enough. Parents seemed satisfied with the information given. Encouraged to continue feeding with hunger cues to ensure the onset of a plentiful milk supply as baby [name] grows. LC also reviewed feeding patterns and growth spurts in the days/weeks to come, ongoing signs of milk transfer after mature milk is in, early feeding cues, skin to skin, and breast and nipple care for mom. Reviewed breast fullness vs engorgement, signs of plugged ducts and mastitis and when to seek care from MD.  Encouraged to call out today with any questions or concerns or any assistance needed before going home. Outpatient lactation services reviewed and community breastfeeding resources given.  Maternal Data Formula Feeding for Exclusion: No Has patient been taught Hand Expression?: Yes (previous LC) Does the patient have breastfeeding experience prior to this delivery?: Yes  Feeding Feeding Type: Breast Fed  LATCH Score                   Interventions Interventions: Breast feeding basics reviewed  Lactation Tools Discussed/Used     Consult Status Consult Status: Complete    Danford Bad 01/08/2020, 9:49 AM

## 2020-01-08 NOTE — Final Progress Note (Signed)
See discharge Summary.  Mirna Mires, CNM  01/08/2020 12:43 PM

## 2020-01-08 NOTE — Plan of Care (Signed)
Vs stable; up ad lib; taking only scheduled motrin for pain control; breastfeeding and has declined assistance this shift; assessment WNL as well and pt requested iv to be removed (there was an order); pt aware of Rhogam she will get today IM

## 2020-01-09 LAB — RHOGAM INJECTION: Unit division: 0

## 2020-02-05 ENCOUNTER — Ambulatory Visit: Payer: BC Managed Care – PPO | Admitting: Nurse Practitioner

## 2020-02-05 ENCOUNTER — Other Ambulatory Visit: Payer: Self-pay

## 2020-02-05 ENCOUNTER — Encounter: Payer: Self-pay | Admitting: Nurse Practitioner

## 2020-02-05 VITALS — BP 122/77 | HR 78 | Temp 97.1°F | Resp 16 | Wt 120.0 lb

## 2020-02-05 DIAGNOSIS — H6123 Impacted cerumen, bilateral: Secondary | ICD-10-CM

## 2020-02-05 NOTE — Progress Notes (Signed)
   Subjective:    Patient ID: Candice Hoover, female    DOB: 03-26-1986, 34 y.o.   MRN: [id number]  HPI Candice Hoover is here today with c/o left ear fullness. She reports ear cerumen since a child and uses persoxide with little to no relief. She is breastfeeding. No respiratory concerns, fever, cough or rashes.   Review of Systems  Constitutional: Negative for fatigue and fever.  HENT: Negative for ear discharge, sinus pressure and sinus pain.        Left ear pressure  Respiratory: Negative for shortness of breath.   Skin: Negative for rash.       Objective:   Physical Exam Constitutional:      General: She is not in acute distress.    Appearance: Normal appearance. She is not ill-appearing, toxic-appearing or diaphoretic.  HENT:     Head: Normocephalic and atraumatic.     Ears:     Comments: Left EAC completely occluded with wax and right partially occluded with yellow wax. Tolerated irrigation/left ear curette use for removal of wax Pulmonary:     Effort: Pulmonary effort is normal.  Musculoskeletal:     Cervical back: Neck supple.  Skin:    General: Skin is warm and dry.  Neurological:     Mental Status: She is alert and oriented to person, place, and time.  Psychiatric:        Mood and Affect: Mood normal.        Behavior: Behavior normal.           Assessment & Plan:  Cerumen impaction: left is completely occluded and right partially, but only wants left ear irrigated.

## 2020-02-05 NOTE — Patient Instructions (Signed)
Continue over the counter debrox as needed Return to the clinic as needed

## 2020-02-27 ENCOUNTER — Other Ambulatory Visit: Payer: Self-pay

## 2020-02-27 ENCOUNTER — Ambulatory Visit (INDEPENDENT_AMBULATORY_CARE_PROVIDER_SITE_OTHER): Payer: BC Managed Care – PPO | Admitting: Certified Nurse Midwife

## 2020-02-27 ENCOUNTER — Encounter: Payer: Self-pay | Admitting: Certified Nurse Midwife

## 2020-02-27 VITALS — BP 104/71 | HR 75 | Ht 64.0 in | Wt 116.0 lb

## 2020-02-27 DIAGNOSIS — Z3A01 Less than 8 weeks gestation of pregnancy: Secondary | ICD-10-CM

## 2020-02-27 DIAGNOSIS — Z6721 Type B blood, Rh negative: Secondary | ICD-10-CM

## 2020-02-27 NOTE — Progress Notes (Signed)
Postpartum Visit  Chief Complaint:  Chief Complaint  Patient presents with  . Postpartum Care    doesn't want bc    History of Present Illness: Candice Hoover is a 34 y.o. [id number] who presents for her 6 week postpartum visit.  Date of delivery: 01/07/20 Type of delivery: Vaginal delivery - Vacuum or forceps assisted  no Episiotomy No.  Laceration: no  Pregnancy or labor problems:  no Any problems since the delivery:  no  Newborn Details:  SINGLETON :  1. Baby's name: [name]. Birth weight: 8#6oz Maternal Details:  Breast Feeding:  yes Post partum depression/anxiety noted:  no Edinburgh Post-Partum Depression Score: 7 Date of last PAP: 06/19/19  normal   Review of Systems: ROS  Past Medical History:  Past Medical History:  Diagnosis Date  . Gestational diabetes    with first baby  . Rh negative state in antepartum period   . Varicella     Past Surgical History:  Past Surgical History:  Procedure Laterality Date  . NO PAST SURGERIES      Family History:  Family History  Problem Relation Age of Onset  . Thyroid disease Sister     Social History:  Social History   Socioeconomic History  . Marital status: Married    Spouse name: Not on file  . Number of children: 2  . Years of education: Not on file  . Highest education level: Not on file  Occupational History  . Not on file  Tobacco Use  . Smoking status: Never Smoker  . Smokeless tobacco: Never Used  Vaping Use  . Vaping Use: Never used  Substance and Sexual Activity  . Alcohol use: Not Currently  . Drug use: Never  . Sexual activity: Not Currently    Birth control/protection: None  Other Topics Concern  . Not on file  Social History Narrative  . Not on file   Social Determinants of Health   Financial Resource Strain:   . Difficulty of Paying Living Expenses: Not on file  Food Insecurity:   . Worried About Programme researcher, broadcasting/film/video in the Last Year: Not on file  . Ran Out of Food in the  Last Year: Not on file  Transportation Needs:   . Lack of Transportation (Medical): Not on file  . Lack of Transportation (Non-Medical): Not on file  Physical Activity:   . Days of Exercise per Week: Not on file  . Minutes of Exercise per Session: Not on file  Stress:   . Feeling of Stress : Not on file  Social Connections:   . Frequency of Communication with Friends and Family: Not on file  . Frequency of Social Gatherings with Friends and Family: Not on file  . Attends Religious Services: Not on file  . Active Member of Clubs or Organizations: Not on file  . Attends Banker Meetings: Not on file  . Marital Status: Not on file  Intimate Partner Violence:   . Fear of Current or Ex-Partner: Not on file  . Emotionally Abused: Not on file  . Physically Abused: Not on file  . Sexually Abused: Not on file    Allergies:  No Known Allergies  Medications: Prior to Admission medications   Medication Sig Start Date End Date Taking? Authorizing Provider  Prenatal Vit-Fe Fumarate-FA (PRENATAL VITAMIN PO) Take by mouth.   Yes [provider]    Physical Exam Vitals:  Vitals:   02/27/20 1438  BP: 104/71  Pulse: 75    General: NAD HEENT: normocephalic, anicteric Neck: No thyroid enlargement, no palpable nodules, no cervical lymphadenpathy Breast: Lactating, no inflammation, no masses, nipples intact Pulmonary: No increased work of breathing, CTAB Abdomen: Soft, non-tender, non-distended.  Umbilicus without lesions.  No hepatomegaly or masses palpable. No evidence of hernia. Genitourinary:  External: no lesions or inflammation    Vagina: Normal vaginal mucosa, no evidence of prolapse.    Cervix: Closed, no bleeding  Uterus: Well involuted, mobile, non-tender, RF  Adnexa: No adnexal masses, non-tender  Rectal: deferred Extremities: no edema, erythema, or tenderness Neurologic: Grossly intact Psychiatric: mood appropriate, affect full   Edinburgh Postnatal  Depression Scale - 02/27/20 1503      Edinburgh Postnatal Depression Scale:  In the Past 7 Days   I have been able to laugh and see the funny side of things. 0    I have looked forward with enjoyment to things. 0    I have blamed myself unnecessarily when things went wrong. 2    I have been anxious or worried for no good reason. 2    I have felt scared or panicky for no good reason. 2    Things have been getting on top of me. 1    I have been so unhappy that I have had difficulty sleeping. 0    I have felt sad or miserable. 0    I have been so unhappy that I have been crying. 0    The thought of harming myself has occurred to me. 0    Edinburgh Postnatal Depression Scale Total 7          Assessment: 34 y.o. [id number] presenting for 6 week postpartum visit  Normal involution  Plan:  1) Contraception Education given regarding options for contraception when breastfeeding, Patient would like to use condoms for contraception.  2)  Pap not due  3) Patient underwent screening for postpartum depression with no concerns noted.  4) Discussed return to normal activity, recommend continuing prenatal vitamins.  5) Follow up 1 year for routine annual exam.  Farrel Conners, CNM

## 2020-03-09 ENCOUNTER — Encounter: Payer: Self-pay | Admitting: Certified Nurse Midwife

## 2020-03-09 DIAGNOSIS — Z6721 Type B blood, Rh negative: Secondary | ICD-10-CM | POA: Insufficient documentation

## 2020-04-20 ENCOUNTER — Emergency Department
Admission: EM | Admit: 2020-04-20 | Discharge: 2020-04-20 | Disposition: A | Discharge status: Home / Self Care | Payer: BC Managed Care – PPO | Attending: Emergency Medicine | Admitting: Emergency Medicine

## 2020-04-20 ENCOUNTER — Encounter: Payer: Self-pay | Admitting: Emergency Medicine

## 2020-04-20 ENCOUNTER — Other Ambulatory Visit: Payer: Self-pay

## 2020-04-20 DIAGNOSIS — Z20822 Contact with and (suspected) exposure to covid-19: Secondary | ICD-10-CM | POA: Diagnosis not present

## 2020-04-20 DIAGNOSIS — E079 Disorder of thyroid, unspecified: Secondary | ICD-10-CM | POA: Diagnosis present

## 2020-04-20 DIAGNOSIS — E0590 Thyrotoxicosis, unspecified without thyrotoxic crisis or storm: Secondary | ICD-10-CM | POA: Diagnosis not present

## 2020-04-20 HISTORY — DX: Disorder of thyroid, unspecified: E07.9

## 2020-04-20 LAB — CBC WITH DIFFERENTIAL/PLATELET
Abs Immature Granulocytes: 0 10*3/uL (ref 0.00–0.07)
Basophils Absolute: 0 10*3/uL (ref 0.0–0.1)
Basophils Relative: 0 %
Eosinophils Absolute: 0.1 10*3/uL (ref 0.0–0.5)
Eosinophils Relative: 2 %
HCT: 43 % (ref 36.0–46.0)
Hemoglobin: 14.9 g/dL (ref 12.0–15.0)
Immature Granulocytes: 0 %
Lymphocytes Relative: 46 %
Lymphs Abs: 2.2 10*3/uL (ref 0.7–4.0)
MCH: 27.9 pg (ref 26.0–34.0)
MCHC: 34.7 g/dL (ref 30.0–36.0)
MCV: 80.4 fL (ref 80.0–100.0)
Monocytes Absolute: 0.5 10*3/uL (ref 0.1–1.0)
Monocytes Relative: 11 %
Neutro Abs: 2 10*3/uL (ref 1.7–7.7)
Neutrophils Relative %: 41 %
Platelets: 275 10*3/uL (ref 150–400)
RBC: 5.35 MIL/uL — ABNORMAL HIGH (ref 3.87–5.11)
RDW: 13.9 % (ref 11.5–15.5)
WBC: 4.8 10*3/uL (ref 4.0–10.5)
nRBC: 0 % (ref 0.0–0.2)

## 2020-04-20 LAB — COMPREHENSIVE METABOLIC PANEL
ALT: 40 U/L (ref 0–44)
AST: 27 U/L (ref 15–41)
Albumin: 4.3 g/dL (ref 3.5–5.0)
Alkaline Phosphatase: 57 U/L (ref 38–126)
Anion gap: 9 (ref 5–15)
BUN: 12 mg/dL (ref 6–20)
CO2: 28 mmol/L (ref 22–32)
Calcium: 9.9 mg/dL (ref 8.9–10.3)
Chloride: 103 mmol/L (ref 98–111)
Creatinine, Ser: 0.79 mg/dL (ref 0.44–1.00)
GFR calc Af Amer: >60 mL/min (ref >60)
GFR calc non Af Amer: >60 mL/min (ref >60)
Glucose, Bld: 154 mg/dL — ABNORMAL HIGH (ref 70–99)
Potassium: 4.1 mmol/L (ref 3.5–5.1)
Sodium: 140 mmol/L (ref 135–145)
Total Bilirubin: 0.9 mg/dL (ref 0.3–1.2)
Total Protein: 7.8 g/dL (ref 6.5–8.1)

## 2020-04-20 LAB — URINALYSIS, COMPLETE (UACMP) WITH MICROSCOPIC
Bilirubin Urine: NEGATIVE
Glucose, UA: NEGATIVE mg/dL
Hgb urine dipstick: NEGATIVE
Ketones, ur: NEGATIVE mg/dL
Nitrite: NEGATIVE
Protein, ur: NEGATIVE mg/dL
Specific Gravity, Urine: 1.017 (ref 1.005–1.030)
pH: 5 (ref 5.0–8.0)

## 2020-04-20 LAB — POCT PREGNANCY, URINE: Preg Test, Ur: NEGATIVE

## 2020-04-20 LAB — T4, FREE: Free T4: 4.04 ng/dL — ABNORMAL HIGH (ref 0.61–1.12)

## 2020-04-20 LAB — RESPIRATORY PANEL BY RT PCR (FLU A&B, COVID)
Influenza A by PCR: NEGATIVE
Influenza B by PCR: NEGATIVE
SARS Coronavirus 2 by RT PCR: NEGATIVE

## 2020-04-20 LAB — TSH: TSH: <0.01 u[IU]/mL — ABNORMAL LOW (ref 0.350–4.500)

## 2020-04-20 MED ORDER — PROPRANOLOL HCL 1 MG/ML IV SOLN
0.5000 mg | Freq: Once | INTRAVENOUS | Status: AC
  Administered 2020-04-20: 0.5 mg via INTRAVENOUS
  Filled 2020-04-20: qty 0.5

## 2020-04-20 MED ORDER — LORAZEPAM 0.5 MG PO TABS
0.5000 mg | ORAL_TABLET | Freq: Once | ORAL | Status: AC
  Administered 2020-04-20: 0.5 mg via ORAL
  Filled 2020-04-20: qty 1

## 2020-04-20 MED ORDER — PROPRANOLOL HCL 20 MG PO TABS
30.0000 mg | ORAL_TABLET | Freq: Once | ORAL | Status: AC
  Administered 2020-04-20: 30 mg via ORAL
  Filled 2020-04-20: qty 2

## 2020-04-20 MED ORDER — PROPRANOLOL HCL 1 MG/ML IV SOLN
0.5000 mg | Freq: Once | INTRAVENOUS | Status: DC
  Filled 2020-04-20 (×2): qty 0.5

## 2020-04-20 MED ORDER — LORAZEPAM 2 MG/ML IJ SOLN: 0.5000 mg | Freq: Once | INTRAMUSCULAR | Status: DC

## 2020-04-20 MED ORDER — PROPRANOLOL HCL 10 MG PO TABS: 30.0000 mg | ORAL_TABLET | Freq: Every day | ORAL | 0 refills | Status: DC

## 2020-04-20 NOTE — ED Notes (Signed)
Called dietary for food tray  

## 2020-04-20 NOTE — ED Notes (Signed)
[facility] has no beds

## 2020-04-20 NOTE — ED Notes (Signed)
Pt ambulated to toilet and had BM and void.

## 2020-04-20 NOTE — Care Management (Signed)
This is a no charge note  I was called for admission by Dr. Vicente Males. Per Dr. Vicente Males, pt has hyperthyroidism, suspecting hyperthyroid storm. Since we do not endocrinologist coverage, I recommend to transfer patient to tertiary facility.  Dr. Vicente Males agreed.   Lorretta Harp, MD  [facility]   If 7PM-7AM, please contact night-coverage [email or web address] 04/20/2020, 6:27 PM

## 2020-04-20 NOTE — ED Notes (Signed)
Husband called pt while this nurse in room.

## 2020-04-20 NOTE — ED Notes (Signed)
Called [facility] for transfer spoke to Brilliant [phone]

## 2020-04-20 NOTE — ED Notes (Signed)
Pt ambulated to bathroom and voided. Pt continues to express anxiety and to cry. This nurse explained that we are monitoring her HR still and recommended to rest in bed.

## 2020-04-20 NOTE — ED Notes (Signed)
Called husband per client request.

## 2020-04-20 NOTE — ED Notes (Signed)
Pt requesting anti-anxiety med. Pt worried because HR continues to be high. Spoke with ED provider who ordered PO ativan and propanolol.

## 2020-04-20 NOTE — ED Provider Notes (Signed)
-----------------------------------------   4:41 PM on 04/20/2020 -----------------------------------------  Blood pressure 121/80, pulse 99, temperature 98.1 F (36.7 C), temperature source Oral, resp. rate (!) 27, height 5\' 4"  (1.626 m), weight 49.4 kg, SpO2 100 %, currently breastfeeding.  Assuming care from Dr. .  In short, Candice Hoover is a 34 y.o. female with a chief complaint of Thyroid Problem .  Refer to the original H&P for additional details.  Dr. 20 spoke with hospitalist Antoine Primas regarding patient's hyperthyroidism.  Per Dr. Kennon Portela, Dr. Katrinka Blazing, recommends starting patient on 30 mg daily of propranolol with outpatient follow-up.  Patient already has outpatient follow-up scheduled for 2 days.  She is feeling improved following the propranolol that she has received in the emergency department.  Dr. Jeneen Montgomery has been in to evaluate the patient.  Patient is agreeable with the plan of care for outpatient follow-up.    Katrinka Blazing, PA-C 04/21/20 1433    06/21/20, MD 04/21/20 2050

## 2020-04-20 NOTE — ED Notes (Signed)
Pt calmer now. Pt talking on phone with husband.

## 2020-04-20 NOTE — ED Notes (Signed)
Called [facility] for transfer [phone]

## 2020-04-20 NOTE — ED Triage Notes (Signed)
Pt here for sensation of skin burning.  Appears anxious. Found out three days ago that has thyroid problems and has appointment to see endocrine this week.  Also has new baby at home.  Tachy in triage. No fever, NVD. No confusion.  Unlabored.  Thyroid levels checked three days ago. Only pain is slight pain to neck that has had.

## 2020-04-20 NOTE — ED Notes (Signed)
ED provider at bedside. Inquiring about invasive thoughts, SI, HI. Pt denies invasive thoughts, SI, HI or hallucinations. Discussing hyperthyroidism symptoms, such as fast HR and anxiety. Provider states will consult with endocrinology as well.

## 2020-04-20 NOTE — ED Notes (Signed)
This nurse attempted IV placement 2 times. Called other nurse to come place IV. Pt crying. Helped pt with deep breathing to help decrease anxiety.

## 2020-04-20 NOTE — ED Notes (Signed)
Pt states over past 3-4 weeks has been feeling anxious. Pt had thyroid labs drawn last week which revealed hyperthyroidism. Pt states her resting HR has been about 115 and with walking has been 150s-160s. Pt states is sleeping about 6 hr/night. She has 86 month old baby and 34 year old toddler at home. Denies depression, SI or HI. States that had "panic attack" on 9/22 and during that same day, felt burning feeling in chest and "burning feeling" all over skin that she still feels. Pt is crying a little.

## 2020-04-20 NOTE — ED Notes (Signed)
Warm blankets provided.

## 2020-04-20 NOTE — ED Notes (Signed)
Provided crackers, peanut butter, applesauce, graham crackers, juice, water to pt.

## 2020-05-16 NOTE — ED Provider Notes (Signed)
[facility] Emergency Department Provider Note   ____________________________________________   First MD Initiated Contact with Patient 04/20/20 1531     (approximate)  I have reviewed the triage vital signs and the nursing notes.   HISTORY  Chief Complaint Thyroid Problem    HPI Candice Hoover is a 34 y.o. female stated past medical history of hyperthyroidism who presents for palpitations and sensation that her "skin is burning".  Patient states that she has a history of high thyroid levels after being checked 3 days ago but has not started any medication for this.  Patient states that the symptoms have worsened over the last 3 days.  Patient also endorses significant anxiety.         Past Medical History:  Diagnosis Date  . Rh negative state in antepartum period   . Thyroid disease   . Varicella     Patient Active Problem List   Diagnosis Date Noted  . Type B blood, Rh negative 03/09/2020  . Labor, precipitous, delivered 01/07/2020  . Normal vaginal delivery of second pregnancy 01/07/2020  . History of gestational diabetes 06/19/2019    Past Surgical History:  Procedure Laterality Date  . NO PAST SURGERIES      Prior to Admission medications   Medication Sig Start Date End Date Taking? Authorizing Provider  Prenatal Vit-Fe Fumarate-FA (PRENATAL VITAMIN PO) Take 1 tablet by mouth daily.    Yes [provider]  propranolol (INDERAL) 10 MG tablet Take 3 tablets (30 mg total) by mouth daily. 04/20/20 05/20/20  Enid Derry, PA-C    Allergies Patient has no known allergies.  Family History  Problem Relation Age of Onset  . Thyroid disease Sister     Social History Social History   Tobacco Use  . Smoking status: Never Smoker  . Smokeless tobacco: Never Used  Vaping Use  . Vaping Use: Never used  Substance Use Topics  . Alcohol use: Not Currently  . Drug use: Never    Review of Systems Constitutional: No  fever/chills Eyes: No visual changes. ENT: No sore throat. Cardiovascular: Denies chest pain. Respiratory: Denies shortness of breath. Gastrointestinal: No abdominal pain.  No nausea, no vomiting.  No diarrhea. Genitourinary: Negative for dysuria. Musculoskeletal: Negative for acute arthralgias Skin: Negative for rash.  Positive for sensation of skin burning or Neurological: Negative for headaches, weakness/numbness/paresthesias in any extremity Psychiatric: Negative for suicidal ideation/homicidal ideation   ____________________________________________   PHYSICAL EXAM:  VITAL SIGNS: ED Triage Vitals [04/20/20 0904]  Enc Vitals Group     BP 124/83     Pulse Rate (!) 133     Resp 18     Temp 98.6 F (37 C)     Temp Source Oral     SpO2 100 %     Weight 109 lb (49.4 kg)     Height 5\' 4"  (1.626 m)     Head Circumference      Peak Flow      Pain Score 0     Pain Loc      Pain Edu?      Excl. in GC?    Constitutional: Alert and oriented. Well appearing and in no acute distress. Eyes: Conjunctivae are normal. PERRL. Head: Atraumatic. Nose: No congestion/rhinnorhea. Mouth/Throat: Mucous membranes are moist. Neck: No stridor Cardiovascular: Grossly normal heart sounds.  Good peripheral circulation. Respiratory: Normal respiratory effort.  No retractions. Gastrointestinal: Soft and nontender. No distention. Musculoskeletal: No obvious deformities Neurologic:  Normal speech and  language. No gross focal neurologic deficits are appreciated. Skin:  Skin is warm and dry. No rash noted. Psychiatric: Mood and affect are normal. Speech and behavior are normal.  ____________________________________________   LABS (all labs ordered are listed, but only abnormal results are displayed)  Labs Reviewed  CBC WITH DIFFERENTIAL/PLATELET - Abnormal; Notable for the following components:      Result Value   RBC 5.35 (*)    All other components within normal limits  COMPREHENSIVE  METABOLIC PANEL - Abnormal; Notable for the following components:   Glucose, Bld 154 (*)    All other components within normal limits  URINALYSIS, COMPLETE (UACMP) WITH MICROSCOPIC - Abnormal; Notable for the following components:   Color, Urine YELLOW (*)    APPearance CLOUDY (*)    Leukocytes,Ua MODERATE (*)    Bacteria, UA FEW (*)    All other components within normal limits  TSH - Abnormal; Notable for the following components:   TSH <0.010 (*)    All other components within normal limits  T4, FREE - Abnormal; Notable for the following components:   Free T4 4.04 (*)    All other components within normal limits  RESPIRATORY PANEL BY RT PCR (FLU A&B, COVID)  POC URINE PREG, ED  POCT PREGNANCY, URINE   ____________________________________________  EKG  ED ECG REPORT I, Merwyn Katos, the attending physician, personally viewed and interpreted this ECG.  Date: 05/16/2020 EKG Time: 0903 Rate: 129 Rhythm: Tachycardic sinus rhythm QRS Axis: normal Intervals: normal ST/T Wave abnormalities: normal Narrative Interpretation: no evidence of acute ischemia  _   PROCEDURES  Procedure(s) performed (including Critical Care):  Procedures   ____________________________________________   INITIAL IMPRESSION / ASSESSMENT AND PLAN / ED COURSE  As part of my medical decision making, I reviewed the following data within the electronic MEDICAL RECORD NUMBER Nursing notes reviewed and incorporated, Labs reviewed, EKG interpreted, Old chart reviewed, Radiograph reviewed and Notes from prior ED visits reviewed and incorporated        Patient is a 34 year old female with stated past medical history of hypothyroidism who presents for palpitations and the sensation that her skin is burning.  Work-up showed significantly elevated T4 at 4.04 which likely is the cause of patient's symptoms.  Patient started treatment with propranolol.  I spoke to Dr. Clyde Lundborg who informed me that we do not have  endocrinology as a consult service here and therefore patient will need to be transferred.  As this was at the end of my shift, I signed this patient out to the oncoming practitioner.  All pertinent patient formation was conveyed and all questions were answered.  All further care and disposition decisions will be made by the oncoming provider.      ____________________________________________   FINAL CLINICAL IMPRESSION(S) / ED DIAGNOSES  Final diagnoses:  Hyperthyroidism     ED Discharge Orders         Ordered    propranolol (INDERAL) 10 MG tablet  Daily        04/20/20 1845           Note:  This document was prepared using Dragon voice recognition software and may include unintentional dictation errors.   Merwyn Katos, MD 05/16/20 2352

## 2020-06-05 ENCOUNTER — Other Ambulatory Visit: Payer: Self-pay | Admitting: Neurology

## 2020-06-05 DIAGNOSIS — R42 Dizziness and giddiness: Secondary | ICD-10-CM

## 2020-06-05 DIAGNOSIS — R2990 Unspecified symptoms and signs involving the nervous system: Secondary | ICD-10-CM

## 2020-06-23 ENCOUNTER — Ambulatory Visit: Payer: BC Managed Care – PPO

## 2020-06-25 ENCOUNTER — Other Ambulatory Visit: Payer: Self-pay

## 2020-06-25 ENCOUNTER — Ambulatory Visit
Admission: RE | Admit: 2020-06-25 | Discharge: 2020-06-25 | Disposition: A | Discharge status: Home / Self Care | Payer: BC Managed Care – PPO | Source: Ambulatory Visit | Attending: Neurology | Admitting: Neurology

## 2020-06-25 ENCOUNTER — Other Ambulatory Visit: Payer: Self-pay | Admitting: Neurology

## 2020-06-25 DIAGNOSIS — R2990 Unspecified symptoms and signs involving the nervous system: Secondary | ICD-10-CM | POA: Insufficient documentation

## 2020-06-25 DIAGNOSIS — R42 Dizziness and giddiness: Secondary | ICD-10-CM | POA: Diagnosis present

## 2020-07-29 IMAGING — CR THORACIC SPINE 2 VIEWS
1 series · 3 of 3 positions shown · non-contrast
Comparison: None.

CLINICAL DATA: Upper back pain after moving.

EXAM:
THORACIC SPINE 2 VIEWS

[Series 1: dg thoracic spine 2 view · 0.14mm/px · 3 of 3 slices shown]
[im 1/3]
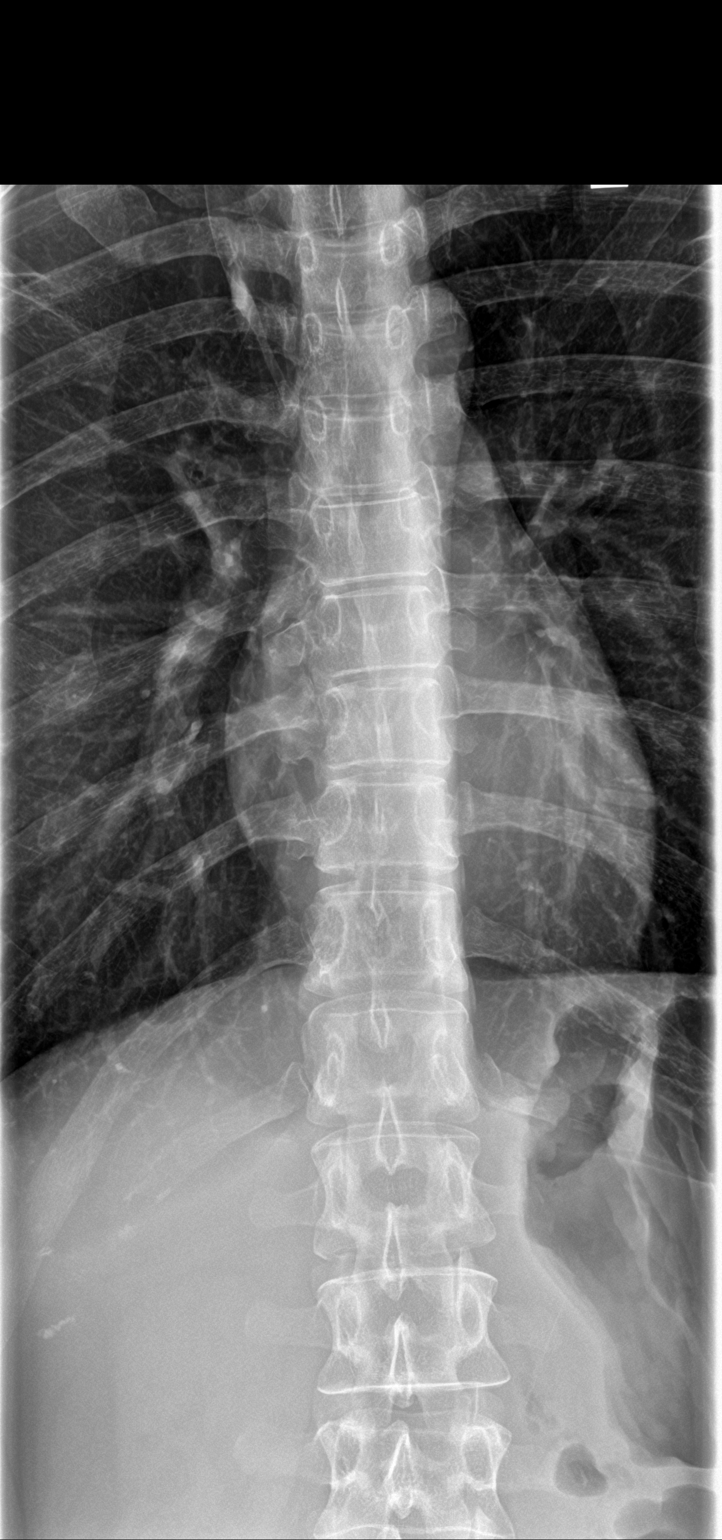
[im 2/3]
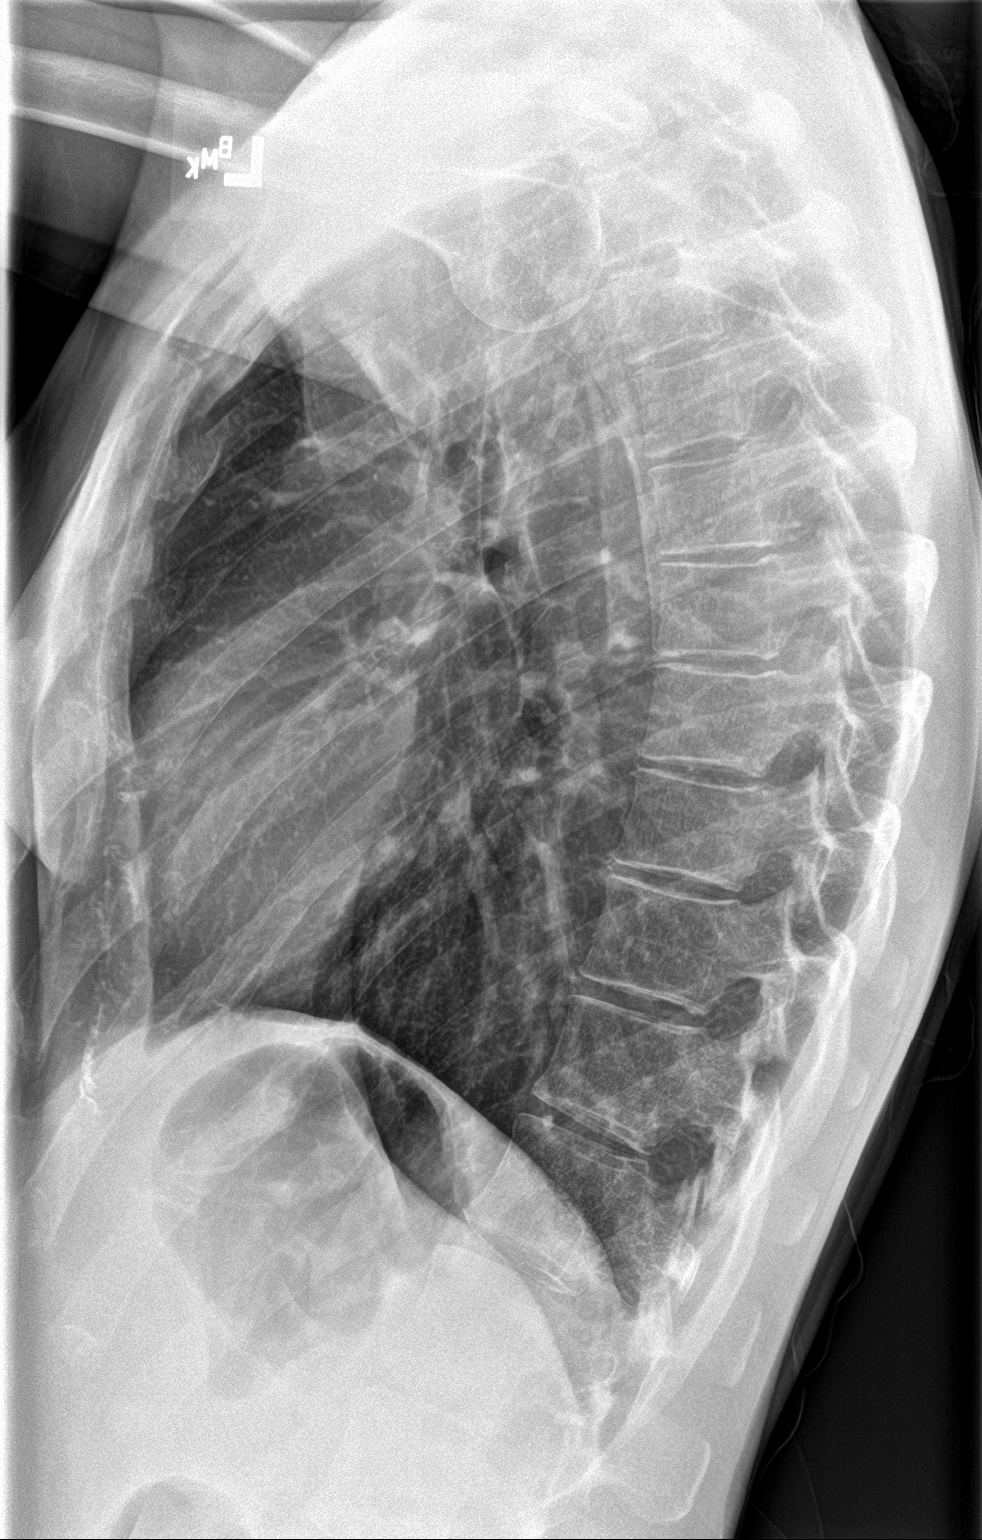
[im 3/3]
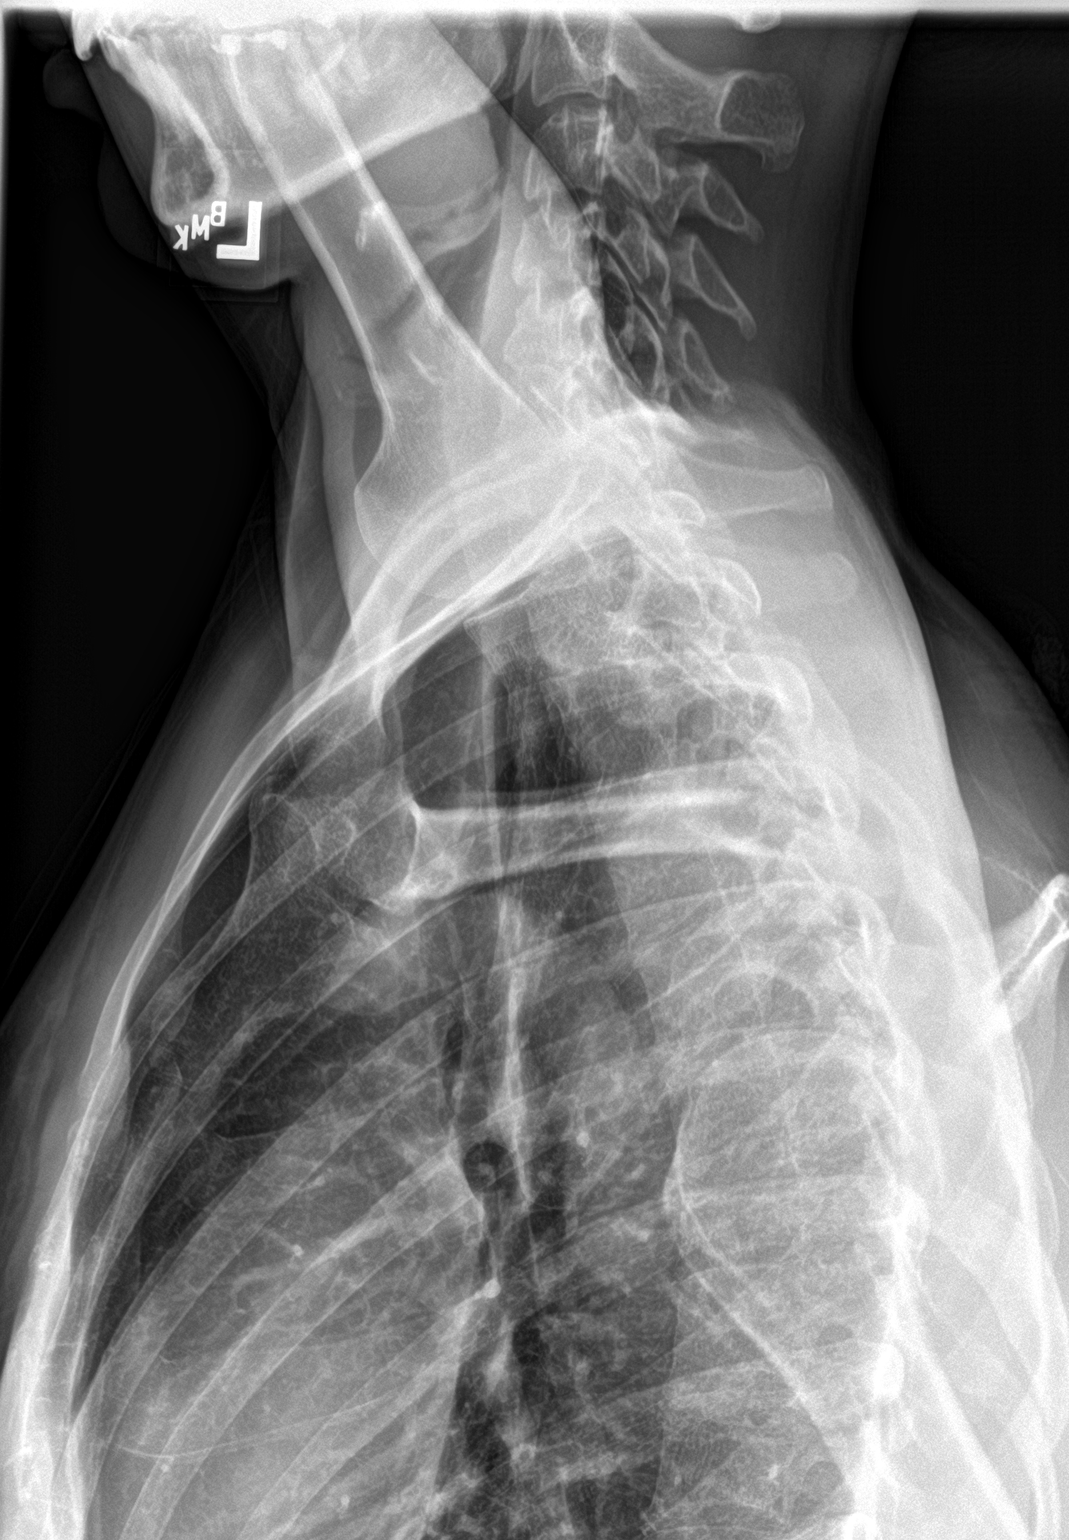

[3 of 3 positions shown; findings below may reference images not displayed]

FINDINGS: Normal anatomic alignment. No evidence for acute fracture or
dislocation. Regional soft tissues unremarkable.
IMPRESSION: No acute osseous abnormality.

## 2020-08-01 ENCOUNTER — Ambulatory Visit: Payer: BC Managed Care – PPO

## 2021-04-28 ENCOUNTER — Other Ambulatory Visit: Payer: Self-pay

## 2021-04-28 ENCOUNTER — Ambulatory Visit (INDEPENDENT_AMBULATORY_CARE_PROVIDER_SITE_OTHER): Payer: BC Managed Care – PPO | Admitting: Obstetrics and Gynecology

## 2021-04-28 ENCOUNTER — Encounter: Payer: Self-pay | Admitting: Obstetrics and Gynecology

## 2021-04-28 VITALS — BP 118/74 | Ht 64.0 in | Wt 114.0 lb

## 2021-04-28 DIAGNOSIS — Z1331 Encounter for screening for depression: Secondary | ICD-10-CM | POA: Diagnosis not present

## 2021-04-28 DIAGNOSIS — Z1339 Encounter for screening examination for other mental health and behavioral disorders: Secondary | ICD-10-CM | POA: Diagnosis not present

## 2021-04-28 DIAGNOSIS — Z01419 Encounter for gynecological examination (general) (routine) without abnormal findings: Secondary | ICD-10-CM

## 2021-04-28 NOTE — Progress Notes (Signed)
Gynecology Annual Exam  PCP: Kandyce Rud, MD  Chief Complaint  Patient presents with   Annual Exam    History of Present Illness:  [name] is a 35 y.o. [id number] who LMP was Patient's last menstrual period was 03/20/2021 (exact date)., presents today for her annual examination.  Her menses are regular every 28-30 days, lasting 5 day(s).  Dysmenorrhea mild, occurring first 1-2 days of flow. She does not have intermenstrual bleeding.  She is sexually active.  No contraception apart from condoms.  Last Pap: 06/2019  Results were: no abnormalities /neg HPV DNA negative Hx of STDs: none  There is no FH of breast cancer. There is no FH of ovarian cancer. The patient does not do self-breast exams.  Tobacco use: The patient denies current or previous tobacco use. Alcohol use: none Exercise: moderately active, yoga  The patient wears seatbelts: yes.   The patient reports that domestic violence in her life is absent.   Past Medical History:  Diagnosis Date   Rh negative state in antepartum period    Thyroid disease    Varicella     Past Surgical History:  Procedure Laterality Date   NO PAST SURGERIES      Prior to Admission medications   Medication Sig Start Date End Date Taking? Authorizing Provider  Prenatal Vit-Fe Fumarate-FA (PRENATAL VITAMIN PO) Take 1 tablet by mouth daily.  Patient not taking: Reported on 04/28/2021    [provider]  propranolol (INDERAL) 10 MG tablet Take 3 tablets (30 mg total) by mouth daily. 04/20/20 05/20/20  Enid Derry, PA-C    No Known Allergies   Obstetric History: [id number]  Social History   Socioeconomic History   Marital status: Married    Spouse name: Not on file   Number of children: 2   Years of education: Not on file   Highest education level: Not on file  Occupational History   Not on file  Tobacco Use   Smoking status: Never   Smokeless tobacco: Never  Vaping Use   Vaping Use: Never used   Substance and Sexual Activity   Alcohol use: Not Currently   Drug use: Never   Sexual activity: Not Currently    Birth control/protection: None  Other Topics Concern   Not on file  Social History Narrative   Not on file   Social Determinants of Health   Financial Resource Strain: Not on file  Food Insecurity: Not on file  Transportation Needs: Not on file  Physical Activity: Not on file  Stress: Not on file  Social Connections: Not on file  Intimate Partner Violence: Not on file    Family History  Problem Relation Age of Onset   Thyroid disease Sister     Review of Systems  Constitutional: Negative.   HENT: Negative.    Eyes: Negative.   Respiratory: Negative.    Cardiovascular: Negative.   Gastrointestinal: Negative.   Genitourinary: Negative.   Musculoskeletal: Negative.   Skin: Negative.   Neurological: Negative.   Psychiatric/Behavioral: Negative.      Physical Exam BP 118/74   Ht 5\' 4"  (1.626 m)   Wt 114 lb (51.7 kg)   LMP 03/20/2021 (Exact Date)   BMI 19.57 kg/m    Physical Exam Constitutional:      General: She is not in acute distress.    Appearance: Normal appearance. She is well-developed.  Genitourinary:     Vulva and bladder normal.     Right Labia: No rash,  tenderness, lesions, skin changes or Bartholin's cyst.    Left Labia: No tenderness, lesions, skin changes, Bartholin's cyst or rash.    No inguinal adenopathy present in the right or left side.    Pelvic Tanner Score: 5/5.    No vaginal discharge, erythema, tenderness or bleeding.      Right Adnexa: not tender, not full and no mass present.    Left Adnexa: not tender, not full and no mass present.    No cervical motion tenderness, discharge, lesion or polyp.     Uterus is not enlarged or tender.     No uterine mass detected.    Pelvic exam was performed with patient in the lithotomy position.  Breasts:    Right: No inverted nipple, mass, nipple discharge, skin change or tenderness.      Left: No inverted nipple, mass, nipple discharge, skin change or tenderness.  HENT:     Head: Normocephalic and atraumatic.  Eyes:     General: No scleral icterus.    Conjunctiva/sclera: Conjunctivae normal.  Neck:     Thyroid: No thyromegaly.  Cardiovascular:     Rate and Rhythm: Normal rate and regular rhythm.     Heart sounds: No murmur heard.   No friction rub. No gallop.  Pulmonary:     Effort: Pulmonary effort is normal. No respiratory distress.     Breath sounds: Normal breath sounds. No wheezing or rales.  Abdominal:     General: Bowel sounds are normal. There is no distension.     Palpations: Abdomen is soft. There is no mass.     Tenderness: There is no abdominal tenderness. There is no guarding or rebound.     Hernia: There is no hernia in the left inguinal area or right inguinal area.  Musculoskeletal:        General: No swelling or tenderness. Normal range of motion.     Cervical back: Normal range of motion and neck supple.  Lymphadenopathy:     Cervical: No cervical adenopathy.     Lower Body: No right inguinal adenopathy. No left inguinal adenopathy.  Neurological:     General: No focal deficit present.     Mental Status: She is alert and oriented to person, place, and time.     Cranial Nerves: No cranial nerve deficit.  Skin:    General: Skin is warm and dry.     Findings: No erythema or rash.  Psychiatric:        Mood and Affect: Mood normal.        Behavior: Behavior normal.        Judgment: Judgment normal.    Female chaperone present for pelvic and breast  portions of the physical exam  Results: AUDIT Questionnaire (screen for alcoholism): 0 PHQ-9: 0   Assessment: 35 y.o. [id number] female here for routine annual gynecologic examination  Plan: Problem List Items Addressed This Visit   None Visit Diagnoses     Women's annual routine gynecological examination    -  Primary   Screening for depression       Screening for alcoholism            Screening: -- Blood pressure screen normal -- Weight screening: normal -- Depression screening negative (PHQ-9) -- Nutrition: normal -- cholesterol screening: not due for screening -- osteoporosis screening: not due -- tobacco screening: not using -- alcohol screening: AUDIT questionnaire indicates low-risk usage. -- family history of breast cancer screening: done. not at high risk. --  no evidence of domestic violence or intimate partner violence. -- STD screening: gonorrhea/chlamydia NAAT not collected per patient request. -- pap smear not collected per ASCCP guidelines -- flu vaccine will plan to get one. -- HPV vaccination series: doesn't remember.  Thomasene Mohair, MD 04/28/2021 2:05 PM

## 2021-09-21 ENCOUNTER — Other Ambulatory Visit: Payer: Self-pay

## 2021-09-21 ENCOUNTER — Ambulatory Visit: Payer: BC Managed Care – PPO | Admitting: Medical

## 2021-09-21 ENCOUNTER — Encounter: Payer: Self-pay | Admitting: Medical

## 2021-09-21 VITALS — BP 114/70 | HR 93 | Temp 98.1°F | Resp 16

## 2021-09-21 DIAGNOSIS — H6983 Other specified disorders of Eustachian tube, bilateral: Secondary | ICD-10-CM

## 2021-09-21 DIAGNOSIS — J029 Acute pharyngitis, unspecified: Secondary | ICD-10-CM

## 2021-09-21 DIAGNOSIS — R0600 Dyspnea, unspecified: Secondary | ICD-10-CM

## 2021-09-21 MED ORDER — AMOXICILLIN-POT CLAVULANATE 875-125 MG PO TABS: 1.0000 | ORAL_TABLET | Freq: Two times a day (BID) | ORAL | 0 refills | Status: DC

## 2021-09-21 NOTE — Patient Instructions (Signed)
Amoxicillin; Clavulanic Acid Chewable Tablets What is this medication? AMOXICILLIN; CLAVULANIC ACID (a mox i SIL in; KLAV yoo lan ic AS id) treats infections caused by bacteria. It belongs to a group of medications called penicillin antibiotics. It will not treat colds, the flu, or infections caused by viruses. This medicine may be used for other purposes; ask your health care provider or pharmacist if you have questions. COMMON BRAND NAME(S): Augmentin What should I tell my care team before I take this medication? They need to know if you have any of these conditions: Kidney disease Liver disease Mononucleosis Phenylketonuria Stomach or intestine problems such as colitis An unusual or allergic reaction to amoxicillin, other penicillin or cephalosporin antibiotics, clavulanic acid, other medications, foods, dyes, or preservatives Pregnant or trying to get pregnant Breast-feeding How should I use this medication? Take this medication by mouth. Take it as directed on the prescription label at the same time every day. Chew or crush it completely before swallowing. Do not swallow tablets whole. You can take it with or without food. If it upsets your stomach, take it with food. Take all of this medication unless your care team tells you to stop it early. Keep taking it even if you think you are better. Talk to your care team about the use of this medication in children. While it may be prescribed for selected conditions, precautions do apply. Overdosage: If you think you have taken too much of this medicine contact a poison control center or emergency room at once. NOTE: This medicine is only for you. Do not share this medicine with others. What if I miss a dose? If you miss a dose, take it as soon as you can. If it is almost time for your next dose, take only that dose. Do not take double or extra doses. What may interact with this medication? Allopurinol Anticoagulants Birth control  pills Methotrexate Probenecid This list may not describe all possible interactions. Give your health care provider a list of all the medicines, herbs, non-prescription drugs, or dietary supplements you use. Also tell them if you smoke, drink alcohol, or use illegal drugs. Some items may interact with your medicine. What should I watch for while using this medication? Tell your care team if your symptoms do not start to get better or if they get worse. This medication may cause serious skin reactions. They can happen weeks to months after starting the medication. Contact your care team right away if you notice fevers or flu-like symptoms with a rash. The rash may be red or purple and then turn into blisters or peeling of the skin. Or, you might notice a red rash with swelling of the face, lips or lymph nodes in your neck or under your arms. This product may contain aspartame, which is a source of phenylalanine. If you have phenylketonuria (PKU), contact your care team for advice. Do not treat diarrhea with over the counter products. Contact your care team if you have diarrhea that lasts more than 2 days or if it is severe and watery. If you have diabetes, you may get a false-positive result for sugar in your urine. Check with your care team. Birth control may not work properly while you are taking this medication. Talk to your care team about using an extra method of birth control. What side effects may I notice from receiving this medication? Side effects that you should report to your care team as soon as possible: Allergic reactions--skin rash, itching, hives, swelling  of the face, lips, tongue, or throat Liver injury--right upper belly pain, loss of appetite, nausea, light-colored stool, dark yellow or brown urine, yellowing skin or eyes, unusual weakness or fatigue Redness, blistering, peeling, or loosening of the skin, including inside the mouth Severe diarrhea, fever Unusual vaginal discharge,  itching, or odor Side effects that usually do not require medical attention (report to your care team if they continue or are bothersome): Diarrhea Nausea Vomiting This list may not describe all possible side effects. Call your doctor for medical advice about side effects. You may report side effects to FDA at 1-800-FDA-1088. Where should I keep my medication? Keep out of the reach of children and pets. Store at room temperature between 20 and 25 degrees C (68 and 77 degrees F). Throw away any unused medication after the expiration date. NOTE: This sheet is a summary. It may not cover all possible information. If you have questions about this medicine, talk to your doctor, pharmacist, or health care provider.  2022 Elsevier/Gold Standard (2020-06-28 00:00:00) Pharyngitis Pharyngitis is a sore throat (pharynx). This is when there is redness, pain, and swelling in your throat. Most of the time, this condition gets better on its own. In some cases, you may need medicine. What are the causes? An infection from a virus. An infection from bacteria. Allergies. What increases the risk? Being 15-41 years old. Being in crowded environments. These include: Daycares. Schools. Dormitories. Living in a place with cold temperatures outside. Having a weakened disease-fighting (immune) system. What are the signs or symptoms? Symptoms may vary depending on the cause. Common symptoms include: Sore throat. Tiredness (fatigue). Low-grade fever. Stuffy nose. Cough. Headache. Other symptoms may include: Glands in the neck (lymph nodes) that are swollen. Skin rashes. Film on the throat or tonsils. This can be caused by an infection from bacteria. Vomiting. Red, itchy eyes. Loss of appetite. Joint pain and muscle aches. Tonsils that are temporarily bigger than usual (enlarged). How is this treated? Many times, treatment is not needed. This condition usually gets better in 3-4 days without  treatment. If the infection is caused by a bacteria, you may be need to take antibiotics. Follow these instructions at home: Medicines Take over-the-counter and prescription medicines only as told by your doctor. If you were prescribed an antibiotic medicine, take it as told by your doctor. Do not stop taking the antibiotic even if you start to feel better. Use throat lozenges or sprays to soothe your throat as told by your doctor. Children can get pharyngitis. Do not give your child aspirin. Managing pain To help with pain, try: Sipping warm liquids, such as: Broth. Herbal tea. Warm water. Eating or drinking cold or frozen liquids, such as frozen ice pops. Rinsing your mouth (gargle) with a salt water mixture 3-4 times a day or as needed. To make salt water, dissolve -1 tsp (3-6 g) of salt in 1 cup (237 mL) of warm water. Do not swallow this mixture. Sucking on hard candy or throat lozenges. Putting a cool-mist humidifier in your bedroom at night to moisten the air. Sitting in the bathroom with the door closed for 5-10 minutes while you run hot water in the shower.  General instructions  Do not smoke or use any products that contain nicotine or tobacco. If you need help quitting, ask your doctor. Rest as told by your doctor. Drink enough fluid to keep your pee (urine) pale yellow. How is this prevented? Wash your hands often for at least 20 seconds  with soap and water. If soap and water are not available, use hand sanitizer. Do not touch your eyes, nose, or mouth with unwashed hands. Wash hands after touching these areas. Do not share cups or eating utensils. Avoid close contact with people who are sick. Contact a doctor if: You have large, tender lumps in your neck. You have a rash. You cough up green, yellow-brown, or bloody spit. Get help right away if: You have a stiff neck. You drool or cannot swallow liquids. You cannot drink or take medicines without vomiting. You  have very bad pain that does not go away with medicine. You have problems breathing, and it is not from a stuffy nose. You have new pain and swelling in your knees, ankles, wrists, or elbows. These symptoms may be an emergency. Get help right away. Call your local emergency services (911 in the [place] Do not wait to see if the symptoms will go away. Do not drive yourself to the hospital. Summary Pharyngitis is a sore throat (pharynx). This is when there is redness, pain, and swelling in your throat. Most of the time, pharyngitis gets better on its own. Sometimes, you may need medicine. If you were prescribed an antibiotic medicine, take it as told by your doctor. Do not stop taking the antibiotic even if you start to feel better. This information is not intended to replace advice given to you by your health care provider. Make sure you discuss any questions you have with your health care provider. Document Revised: 10/01/2020 Document Reviewed: 10/01/2020 Elsevier Patient Education  2022 [employer].

## 2021-09-21 NOTE — Progress Notes (Signed)
? ?  Subjective:  ? ? Patient ID: Candice Hoover, female    DOB: Aug 04, 1985, 36 y.o.   MRN: [id number] ? ?HPI ?36 yo female in non acute distress. ?Started  Friday with ST and left ear pain.increasingly worsened but feels a little better today. ? ? ?Blood pressure 114/70, pulse 93, temperature 98.1 ?F (36.7 ?C), temperature source Tympanic, resp. rate 16, SpO2 97 %, currently breastfeeding. ?No Known Allergies ? ? ?Review of Systems  ?Constitutional:  Positive for fever (38 C). Negative for chills.  ?HENT:  Positive for ear pain (left), postnasal drip, rhinorrhea, sinus pain and sore throat. Negative for congestion.   ?Respiratory:  Negative for cough and shortness of breath.   ?Cardiovascular:  Negative for chest pain.  ?Gastrointestinal:  Negative for diarrhea, nausea and vomiting.  ?Allergic/Immunologic: Negative for environmental allergies.  ?Neurological:  Negative for dizziness, syncope, light-headedness and headaches.  ? ?   ?Objective:  ? Physical Exam ?Vitals and nursing note reviewed.  ?Constitutional:   ?   Appearance: Normal appearance.  ?HENT:  ?   Head: Normocephalic and atraumatic.  ?   Right Ear: Ear canal and external ear normal. A middle ear effusion is present.  ?   Left Ear: Ear canal and external ear normal. A middle ear effusion is present.  ?   Nose: Nose normal.  ?   Mouth/Throat:  ?   Mouth: Mucous membranes are moist.  ?   Pharynx: Oropharyngeal exudate and posterior oropharyngeal erythema present.  ?Eyes:  ?   Extraocular Movements: Extraocular movements intact.  ?   Conjunctiva/sclera: Conjunctivae normal.  ?   Pupils: Pupils are equal, round, and reactive to light.  ?Cardiovascular:  ?   Rate and Rhythm: Normal rate and regular rhythm.  ?Pulmonary:  ?   Effort: Pulmonary effort is normal.  ?   Breath sounds: Normal breath sounds.  ?Musculoskeletal:     ?   General: Normal range of motion.  ?   Cervical back: Normal range of motion and neck supple.  ?Lymphadenopathy:  ?   Cervical:  Cervical adenopathy present.  ?Skin: ?   General: Skin is warm and dry.  ?   Capillary Refill: Capillary refill takes less than 2 seconds.  ?Neurological:  ?   General: No focal deficit present.  ?   Mental Status: She is alert and oriented to person, place, and time.  ?Psychiatric:     ?   Mood and Affect: Mood normal.     ?   Thought Content: Thought content normal.     ?   Judgment: Judgment normal.  ? ? ? ? ? ?   ?Assessment & Plan:  ?Pharyngitis ?Eustachian tube dysfunction bilaterally. ?PND ?Meds ordered this encounter  ?Medications  ? amoxicillin-clavulanate (AUGMENTIN) 875-125 MG tablet  ?  Sig: Take 1 tablet by mouth 2 (two) times daily.  ?  Dispense:  20 tablet  ?  Refill:  0  ? Dilute salt water gargles. ?OTC Zyrtec or Claritin to help with PND. ?Return If not improving in  3-5 days. Patient verbalizes understanding and has no questions at discharge. ? ?

## 2021-09-28 ENCOUNTER — Encounter: Payer: Self-pay | Admitting: Medical

## 2021-09-28 ENCOUNTER — Other Ambulatory Visit: Payer: Self-pay

## 2021-09-28 ENCOUNTER — Ambulatory Visit: Payer: BC Managed Care – PPO | Admitting: Medical

## 2021-09-28 VITALS — BP 100/68 | HR 91 | Temp 97.0°F | Resp 16

## 2021-09-28 DIAGNOSIS — H6983 Other specified disorders of Eustachian tube, bilateral: Secondary | ICD-10-CM

## 2021-09-28 MED ORDER — PREDNISONE 10 MG (21) PO TBPK: ORAL_TABLET | ORAL | 0 refills | Status: DC

## 2021-09-28 NOTE — Progress Notes (Signed)
? ?  Subjective:  ? ? Patient ID: Candice Hoover, female    DOB: 02-11-1986, 36 y.o.   MRN: [id number] ? ?HPI ?36 yo female in non acute distress. States after getting antibiotic that her she seemed to have caught a cold. Now with fullness in ears, traveling in 2 weeks and would like her ears to be better. Still taking antibiotics and throat is much better. No fever or chills. ? ?Blood pressure 100/68, pulse 91, temperature (!) 97 ?F (36.1 ?C), temperature source Tympanic, resp. rate 16, last menstrual period 09/20/2021, SpO2 99 %, currently breastfeeding. ? ?No Known Allergies ? ?Review of Systems  ?Constitutional:  Negative for chills and fever.  ?HENT:  Positive for congestion and ear pain (and decreased hearing.). Negative for postnasal drip.   ?Respiratory:  Negative for cough and wheezing.   ?Cardiovascular:  Negative for chest pain.  ?Gastrointestinal:  Negative for diarrhea, nausea and vomiting.  ?Neurological:  Negative for headaches.  ? ?   ?Objective:  ? Physical Exam ?Constitutional:   ?   Appearance: Normal appearance. She is normal weight.  ?HENT:  ?   Head: Normocephalic.  ?   Right Ear: Hearing, ear canal and external ear normal. No decreased hearing noted. A middle ear effusion is present. Tympanic membrane is not erythematous, retracted or bulging.  ?   Left Ear: Hearing, ear canal and external ear normal. A middle ear effusion is present. Tympanic membrane is not erythematous, retracted or bulging.  ?   Mouth/Throat:  ?   Mouth: Mucous membranes are moist.  ?   Pharynx: Oropharynx is clear. Posterior oropharyngeal erythema (improving compared to last visit) present.  ?Eyes:  ?   Extraocular Movements: Extraocular movements intact.  ?   Conjunctiva/sclera: Conjunctivae normal.  ?   Pupils: Pupils are equal, round, and reactive to light.  ?Cardiovascular:  ?   Rate and Rhythm: Normal rate and regular rhythm.  ?Pulmonary:  ?   Effort: Pulmonary effort is normal.  ?   Breath sounds: Normal breath  sounds.  ?Musculoskeletal:     ?   General: Normal range of motion.  ?   Cervical back: Normal range of motion and neck supple.  ?Skin: ?   General: Skin is warm and dry.  ?   Capillary Refill: Capillary refill takes less than 2 seconds.  ?Neurological:  ?   General: No focal deficit present.  ?   Mental Status: She is alert and oriented to person, place, and time. Mental status is at baseline.  ?Psychiatric:     ?   Mood and Affect: Mood normal.     ?   Thought Content: Thought content normal.     ?   Judgment: Judgment normal.  ? ? ? ? ? ?   ?Assessment & Plan:  ?Eustachian tube dysfunction bilateral ?Meds ordered this encounter  ?Medications  ? predniSONE (STERAPRED UNI-PAK 21 TAB) 10 MG (21) TBPK tablet  ?  Sig: Take  6 tablets  by mouth today then 5 tablets  tomorrow then one less tablet every day there after.  ?  Dispense:  21 tablet  ?  Refill:  0  ? OTC Zyrtec or Claritin per package instructions. ?Follow up in 5-7 days if not improving. Patient verbalizes under standing and has no  questions at discharge. ? ? ? ?

## 2021-09-28 NOTE — Patient Instructions (Signed)
Eustachian Tube Dysfunction °Eustachian tube dysfunction refers to a condition in which a blockage develops in the narrow passage that connects the middle ear to the back of the nose (eustachian tube). The eustachian tube regulates air pressure in the middle ear by letting air move between the ear and nose. It also helps to drain fluid from the middle ear space. °Eustachian tube dysfunction can affect one or both ears. When the eustachian tube does not function properly, air pressure, fluid, or both can build up in the middle ear. °What are the causes? °This condition occurs when the eustachian tube becomes blocked or cannot open normally. Common causes of this condition include: °Ear infections. °Colds and other infections that affect the nose, mouth, and throat (upper respiratory tract). °Allergies. °Irritation from cigarette smoke. °Irritation from stomach acid coming up into the esophagus (gastroesophageal reflux). The esophagus is the part of the body that moves food from the mouth to the stomach. °Sudden changes in air pressure, such as from descending in an airplane or scuba diving. °Abnormal growths in the nose or throat, such as: °Growths that line the nose (nasal polyps). °Abnormal growth of cells (tumors). °Enlarged tissue at the back of the throat (adenoids). °What increases the risk? °You are more likely to develop this condition if: °You smoke. °You are overweight. °You are a child who has: °Certain birth defects of the mouth, such as cleft palate. °Large tonsils or adenoids. °What are the signs or symptoms? °Common symptoms of this condition include: °A feeling of fullness in the ear. °Ear pain. °Clicking or popping noises in the ear. °Ringing in the ear (tinnitus). °Hearing loss. °Loss of balance. °Dizziness. °Symptoms may get worse when the air pressure around you changes, such as when you travel to an area of high elevation, fly on an airplane, or go scuba diving. °How is this diagnosed? °This  condition may be diagnosed based on: °Your symptoms. °A physical exam of your ears, nose, and throat. °Tests, such as those that measure: °The movement of your eardrum. °Your hearing (audiometry). °How is this treated? °Treatment depends on the cause and severity of your condition. °In mild cases, you may relieve your symptoms by moving air into your ears. This is called "popping the ears." °In more severe cases, or if you have symptoms of fluid in your ears, treatment may include: °Medicines to relieve congestion (decongestants). °Medicines that treat allergies (antihistamines). °Nasal sprays or ear drops that contain medicines that reduce swelling (steroids). °A procedure to drain the fluid in your eardrum. In this procedure, a small tube may be placed in the eardrum to: °Drain the fluid. °Restore the air in the middle ear space. °A procedure to insert a balloon device through the nose to inflate the opening of the eustachian tube (balloon dilation). °Follow these instructions at home: °Lifestyle °Do not do any of the following until your health care provider approves: °Travel to high altitudes. °Fly in airplanes. °Work in a pressurized cabin or room. °Scuba dive. °Do not use any products that contain nicotine or tobacco. These products include cigarettes, chewing tobacco, and vaping devices, such as e-cigarettes. If you need help quitting, ask your health care provider. °Keep your ears dry. Wear fitted earplugs during showering and bathing. Dry your ears completely after. °General instructions °Take over-the-counter and prescription medicines only as told by your health care provider. °Use techniques to help pop your ears as recommended by your health care provider. These may include: °Chewing gum. °Yawning. °Frequent, forceful swallowing. °Closing   your mouth, holding your nose closed, and gently blowing as if you are trying to blow air out of your nose. °Keep all follow-up visits. This is important. °Contact a  health care provider if: °Your symptoms do not go away after treatment. °Your symptoms come back after treatment. °You are unable to pop your ears. °You have: °A fever. °Pain in your ear. °Pain in your head or neck. °Fluid draining from your ear. °Your hearing suddenly changes. °You become very dizzy. °You lose your balance. °Get help right away if: °You have a sudden, severe increase in any of your symptoms. °Summary °Eustachian tube dysfunction refers to a condition in which a blockage develops in the eustachian tube. °It can be caused by ear infections, allergies, inhaled irritants, or abnormal growths in the nose or throat. °Symptoms may include ear pain or fullness, hearing loss, or ringing in the ears. °Mild cases are treated with techniques to unblock the ears, such as yawning or chewing gum. °More severe cases are treated with medicines or procedures. °This information is not intended to replace advice given to you by your health care provider. Make sure you discuss any questions you have with your health care provider. °Document Revised: 09/15/2020 Document Reviewed: 09/15/2020 °Elsevier Patient Education © 2022 [employer] ° °

## 2022-02-19 ENCOUNTER — Ambulatory Visit (INDEPENDENT_AMBULATORY_CARE_PROVIDER_SITE_OTHER): Payer: Self-pay | Admitting: Nurse Practitioner

## 2022-02-19 ENCOUNTER — Encounter: Payer: Self-pay | Admitting: Nurse Practitioner

## 2022-02-19 VITALS — BP 108/62 | HR 56 | Temp 97.1°F | Wt 109.8 lb

## 2022-02-19 DIAGNOSIS — J020 Streptococcal pharyngitis: Secondary | ICD-10-CM

## 2022-02-19 LAB — POCT RAPID STREP A (OFFICE): Rapid Strep A Screen: POSITIVE — AB

## 2022-02-19 LAB — POC COVID19 BINAXNOW: SARS Coronavirus 2 Ag: NEGATIVE

## 2022-02-19 MED ORDER — PENICILLIN V POTASSIUM 500 MG PO TABS: 500.0000 mg | ORAL_TABLET | Freq: Two times a day (BID) | ORAL | 0 refills | Status: AC

## 2022-02-19 NOTE — Progress Notes (Signed)
   Subjective:    Patient ID: Candice Hoover, female    DOB: Nov 29, 1985, 36 y.o.   MRN: [id number]  HPI  36 year old female presenting with complaints of sore throat and fever with HA that started yesterday.   She took Motrin last night and tylenol about 4 hours ago.   Son (36 years old) was sick with similar symptoms one week ago, symptoms persisted for 3 days and resolved.   Was not tested unsure of son's dx presumed viral   Vitals:   02/19/22 0850  BP: 108/62  Pulse: (!) 56  Temp: (!) 97.1 F (36.2 C)  SpO2: 97%      Review of Systems  Constitutional:  Positive for fever.  HENT:  Positive for sore throat.   Eyes: Negative.   Respiratory: Negative.    Cardiovascular: Negative.   Genitourinary: Negative.   Neurological:  Positive for headaches.       Objective:   Physical Exam HENT:     Head: Normocephalic.     Right Ear: Tympanic membrane normal.     Left Ear: Tympanic membrane normal.     Nose: No congestion.     Mouth/Throat:     Pharynx: Posterior oropharyngeal erythema present. No oropharyngeal exudate.  Eyes:     Pupils: Pupils are equal, round, and reactive to light.  Cardiovascular:     Rate and Rhythm: Normal rate and regular rhythm.     Heart sounds: Normal heart sounds.  Pulmonary:     Effort: Pulmonary effort is normal.  Musculoskeletal:     Cervical back: Normal range of motion.  Skin:    General: Skin is warm.  Neurological:     General: No focal deficit present.     Mental Status: She is alert.      Recent Results (from the past 2160 hour(s))  POCT rapid strep A     Status: Abnormal   Collection Time: 02/19/22  9:05 AM  Result Value Ref Range   Rapid Strep A Screen Positive (A) Negative  POC COVID-19     Status: Normal   Collection Time: 02/19/22  9:05 AM  Result Value Ref Range   SARS Coronavirus 2 Ag Negative Negative        Assessment & Plan:   1. Strep throat Continue to alternated tylenol and advil for pain and fever  relief   - penicillin v potassium (VEETID) 500 MG tablet; Take 1 tablet (500 mg total) by mouth in the morning and at bedtime for 10 days.  Dispense: 20 tablet; Refill: 0 - POCT rapid strep A - POC COVID-19

## 2022-06-07 ENCOUNTER — Ambulatory Visit (INDEPENDENT_AMBULATORY_CARE_PROVIDER_SITE_OTHER): Payer: Self-pay | Admitting: Physician Assistant

## 2022-06-07 ENCOUNTER — Encounter: Payer: Self-pay | Admitting: Physician Assistant

## 2022-06-07 ENCOUNTER — Other Ambulatory Visit: Payer: Self-pay

## 2022-06-07 VITALS — BP 121/84 | HR 88 | Temp 96.4°F | Wt 112.8 lb

## 2022-06-07 DIAGNOSIS — J069 Acute upper respiratory infection, unspecified: Secondary | ICD-10-CM

## 2022-06-07 NOTE — Progress Notes (Signed)
[facility] [address] [place], [place] [address]   Office Visit Note  Patient Name: Candice Hoover Date of Birth [id number]  Medical Record number [id number]  Date of Service: 06/07/2022  Chief Complaint  Patient presents with   Cough    States she got sick 3 weeks ago with congestion and runny nose, 2 weeks is when the cough started, wet cough, no history of asthma, concerned for her allergies    Cast Removal     36 y/o F presents to the clinic for c/o cough, nasal congestion, post nasal drainage x 3 weeks. Cough started about 2 weeks ago and has improved. She has used Flonase nasal spray and is helping. Denies CP, SOB, wheezing, fever, or body aches.   Cough Associated symptoms include postnasal drip. Pertinent negatives include no sore throat or wheezing.      Current Medication:  Outpatient Encounter Medications as of 06/07/2022  Medication Sig   Cholecalciferol 25 MCG (1000 UT) capsule Take by mouth.   cyanocobalamin (VITAMIN B12) 1000 MCG tablet Take by mouth.   omega-3 acid ethyl esters (LOVAZA) 1 g capsule Take by mouth.   [DISCONTINUED] predniSONE (STERAPRED UNI-PAK 21 TAB) 10 MG (21) TBPK tablet Take  6 tablets  by mouth today then 5 tablets  tomorrow then one less tablet every day there after. (Patient not taking: Reported on 02/19/2022)   [DISCONTINUED] Prenatal Vit-Fe Fumarate-FA (PRENATAL VITAMIN PO) Take 1 tablet by mouth daily.  (Patient not taking: Reported on 04/28/2021)   [DISCONTINUED] propranolol (INDERAL) 10 MG tablet Take 3 tablets (30 mg total) by mouth daily.   No facility-administered encounter medications on file as of 06/07/2022.      Medical History: Past Medical History:  Diagnosis Date   Rh negative state in antepartum period    Thyroid disease    Varicella      Vital Signs: BP 121/84   Pulse 88   Temp (!) 96.4 F (35.8 C) (Tympanic)   Wt 112 lb 12.8 oz (51.2 kg)   SpO2 98%   BMI 19.36 kg/m    Review of Systems   Constitutional: Negative.   HENT:  Positive for congestion, postnasal drip and sinus pressure. Negative for sore throat and trouble swallowing.   Eyes: Negative.   Respiratory:  Positive for cough. Negative for chest tightness and wheezing.   Cardiovascular: Negative.   Neurological: Negative.     Physical Exam Constitutional:      Appearance: Normal appearance.  HENT:     Head: Atraumatic.     Right Ear: Tympanic membrane, ear canal and external ear normal.     Left Ear: Tympanic membrane, ear canal and external ear normal.     Nose: Nose normal.     Right Turbinates: Enlarged.     Left Turbinates: Enlarged.     Right Sinus: No maxillary sinus tenderness or frontal sinus tenderness.     Left Sinus: No maxillary sinus tenderness or frontal sinus tenderness.     Mouth/Throat:     Mouth: Mucous membranes are moist.     Pharynx: Oropharynx is clear.  Eyes:     Extraocular Movements: Extraocular movements intact.  Cardiovascular:     Rate and Rhythm: Normal rate and regular rhythm.  Pulmonary:     Effort: Pulmonary effort is normal.     Breath sounds: Normal breath sounds.  Musculoskeletal:     Cervical back: Neck supple.  Skin:    General: Skin is warm.  Neurological:  Mental Status: She is alert.  Psychiatric:        Mood and Affect: Mood normal.        Behavior: Behavior normal.        Thought Content: Thought content normal.        Judgment: Judgment normal.       Assessment/Plan:  1. URI with cough and congestion  Reviewed my clinical findings with patient Increase fluids Continue with humidifier Continue with Flonase nasal spray Start cough expectorant ie Mucinex as directed on the box. Start oral decongestant ie Sudafed as directed on the box. Continue to watch for worsening symptoms. Pt verbalized understanding and in agreement.   General Counseling: Ladelle verbalizes understanding of the findings of todays visit and agrees with plan of treatment.  I have discussed any further diagnostic evaluation that may be needed or ordered today. We also reviewed her medications today. she has been encouraged to call the office with any questions or concerns that should arise related to todays visit.    Time spent:20 Minutes    Gilberto Better, [place] Physician Assistant

## 2023-05-26 ENCOUNTER — Emergency Department: Payer: BC Managed Care – PPO

## 2023-05-26 ENCOUNTER — Other Ambulatory Visit: Payer: Self-pay

## 2023-05-26 ENCOUNTER — Encounter: Payer: Self-pay | Admitting: *Deleted

## 2023-05-26 DIAGNOSIS — Z3A12 12 weeks gestation of pregnancy: Secondary | ICD-10-CM | POA: Diagnosis not present

## 2023-05-26 DIAGNOSIS — O4691 Antepartum hemorrhage, unspecified, first trimester: Secondary | ICD-10-CM | POA: Diagnosis present

## 2023-05-26 DIAGNOSIS — E876 Hypokalemia: Secondary | ICD-10-CM | POA: Insufficient documentation

## 2023-05-26 DIAGNOSIS — O99281 Endocrine, nutritional and metabolic diseases complicating pregnancy, first trimester: Secondary | ICD-10-CM | POA: Insufficient documentation

## 2023-05-26 LAB — URINALYSIS, ROUTINE W REFLEX MICROSCOPIC
Bilirubin Urine: NEGATIVE
Glucose, UA: NEGATIVE mg/dL
Ketones, ur: NEGATIVE mg/dL
Leukocytes,Ua: NEGATIVE
Nitrite: NEGATIVE
Protein, ur: NEGATIVE mg/dL
Specific Gravity, Urine: 1.03 (ref 1.005–1.030)
pH: 5 (ref 5.0–8.0)

## 2023-05-26 LAB — CBC
HCT: 41.2 % (ref 36.0–46.0)
Hemoglobin: 14 g/dL (ref 12.0–15.0)
MCH: 30.8 pg (ref 26.0–34.0)
MCHC: 34 g/dL (ref 30.0–36.0)
MCV: 90.7 fL (ref 80.0–100.0)
Platelets: 226 10*3/uL (ref 150–400)
RBC: 4.54 MIL/uL (ref 3.87–5.11)
RDW: 13.2 % (ref 11.5–15.5)
WBC: 11.1 10*3/uL — ABNORMAL HIGH (ref 4.0–10.5)
nRBC: 0 % (ref 0.0–0.2)

## 2023-05-26 LAB — COMPREHENSIVE METABOLIC PANEL
ALT: 17 U/L (ref 0–44)
AST: 16 U/L (ref 15–41)
Albumin: 4.3 g/dL (ref 3.5–5.0)
Alkaline Phosphatase: 53 U/L (ref 38–126)
Anion gap: 12 (ref 5–15)
BUN: 21 mg/dL — ABNORMAL HIGH (ref 6–20)
CO2: 23 mmol/L (ref 22–32)
Calcium: 9.2 mg/dL (ref 8.9–10.3)
Chloride: 98 mmol/L (ref 98–111)
Creatinine, Ser: 0.8 mg/dL (ref 0.44–1.00)
GFR, Estimated: >60 mL/min (ref >60)
Glucose, Bld: 139 mg/dL — ABNORMAL HIGH (ref 70–99)
Potassium: 3.1 mmol/L — ABNORMAL LOW (ref 3.5–5.1)
Sodium: 133 mmol/L — ABNORMAL LOW (ref 135–145)
Total Bilirubin: 0.8 mg/dL (ref <1.2)
Total Protein: 7.5 g/dL (ref 6.5–8.1)

## 2023-05-26 LAB — POC URINE PREG, ED: Preg Test, Ur: POSITIVE — AB

## 2023-05-26 LAB — HCG, QUANTITATIVE, PREGNANCY: hCG, Beta Chain, Quant, S: 827 m[IU]/mL — ABNORMAL HIGH (ref <5)

## 2023-05-26 NOTE — ED Triage Notes (Signed)
Pt states she is approx [redacted] weeks pregnant.  Pt has abd cramping and bleeding that started tonight.  Pt tearful   pt alert  speech clear.

## 2023-05-26 NOTE — ED Notes (Signed)
Poct Pregnancy Positive  

## 2023-05-26 NOTE — ED Notes (Addendum)
Lab reports blood type on file, indicating pt is B neg; requests order for type & screen

## 2023-05-27 ENCOUNTER — Emergency Department
Admission: EM | Admit: 2023-05-27 | Discharge: 2023-05-27 | Disposition: A | Discharge status: Home / Self Care | Payer: BC Managed Care – PPO | Attending: Emergency Medicine | Admitting: Emergency Medicine

## 2023-05-27 DIAGNOSIS — O4690 Antepartum hemorrhage, unspecified, unspecified trimester: Secondary | ICD-10-CM

## 2023-05-27 DIAGNOSIS — E876 Hypokalemia: Secondary | ICD-10-CM

## 2023-05-27 MED ORDER — POTASSIUM CHLORIDE CRYS ER 20 MEQ PO TBCR
40.0000 meq | EXTENDED_RELEASE_TABLET | Freq: Once | ORAL | Status: AC
  Administered 2023-05-27: 40 meq via ORAL
  Filled 2023-05-27: qty 2

## 2023-05-27 NOTE — Discharge Instructions (Addendum)
You were seen in the emergency department today for evaluation of your vaginal bleeding.  Your ultrasound unfortunately did not show a heartbeat and I am concerned that you have had a miscarriage.    You should receive a RhoGAM injection based on your blood type.  You did not wish to have this in the emergency department. Please contact the clinic to ensure that this is completed as soon as possible.  Please return to the emergency department for any new or worsening symptoms including heavy vaginal bleeding requiring you to change a pad or tampon more than once an hour, or any other new or concerning symptoms.

## 2023-05-27 NOTE — ED Notes (Signed)
Patient discharged from ED by provider. Discharge instructions reviewed with patient and all questions answered. Also informed of importance of receiving Rhogam shot as soon as possible since she declined such in ED this evening.  Patient ambulatory from ED in NAD.

## 2023-05-27 NOTE — ED Provider Notes (Signed)
[facility] Provider Note    Event Date/Time   First MD Initiated Contact with Patient 05/27/23 [phone]     (approximate)   History   Abdominal Pain   HPI  Candice Hoover is a 37 year old G3P2 at estimated [redacted] weeks gestation presenting to the emergency department for evaluation of vaginal bleeding and cramping.  Shortly prior to presentation, patient had a small amount of vaginal spotting with lower abdominal cramping.  Much less bleeding than her menstrual cycle.  Denies other complaints.  Is established with [facility].  Documentation reports IUP in this pregnancy.    Physical Exam   Triage Vital Signs: ED Triage Vitals  Encounter Vitals Group     BP 05/26/23 2112 (!) 128/91     Systolic BP Percentile --      Diastolic BP Percentile --      Pulse Rate 05/26/23 2112 88     Resp 05/26/23 2112 18     Temp 05/26/23 2112 98.4 F (36.9 C)     Temp Source 05/26/23 2112 Oral     SpO2 05/26/23 2112 100 %     Weight 05/26/23 2110 107 lb (48.5 kg)     Height 05/26/23 2110 5\' 3"  (1.6 m)     Head Circumference --      Peak Flow --      Pain Score 05/26/23 2109 0     Pain Loc --      Pain Education --      Exclude from Growth Chart --     Most recent vital signs: Vitals:   05/26/23 2112 05/27/23 0230  BP: (!) 128/91 119/83  Pulse: 88 90  Resp: 18   Temp: 98.4 F (36.9 C)   SpO2: 100% 100%     General: Awake, interactive  CV:  Regular rate, good peripheral perfusion.  Resp:  Unlabored respirations.  Abd:  Nondistended, nontender, soft Neuro:  Symmetric facial movement, fluid speech   ED Results / Procedures / Treatments   Labs (all labs ordered are listed, but only abnormal results are displayed) Labs Reviewed  COMPREHENSIVE METABOLIC PANEL - Abnormal; Notable for the following components:      Result Value   Sodium 133 (*)    Potassium 3.1 (*)    Glucose, Bld 139 (*)    BUN 21 (*)    All other components within  normal limits  CBC - Abnormal; Notable for the following components:   WBC 11.1 (*)    All other components within normal limits  URINALYSIS, ROUTINE W REFLEX MICROSCOPIC - Abnormal; Notable for the following components:   Color, Urine YELLOW (*)    APPearance HAZY (*)    Hgb urine dipstick LARGE (*)    Bacteria, UA FEW (*)    All other components within normal limits  HCG, QUANTITATIVE, PREGNANCY - Abnormal; Notable for the following components:   hCG, Beta Chain, Quant, S 827 (*)    All other components within normal limits  POC URINE PREG, ED - Abnormal; Notable for the following components:   Preg Test, Ur POSITIVE (*)    All other components within normal limits  TYPE AND SCREEN     EKG EKG independently reviewed interpreted by myself (ER attending) demonstrates:    RADIOLOGY Imaging independently reviewed and interpreted by myself demonstrates:  US demonstrates IUP without visualized cardiac activity  PROCEDURES:  Critical Care performed: No  Procedures   MEDICATIONS ORDERED IN ED: Medications  potassium chloride  SA (KLOR-CON M) CR tablet 40 mEq (40 mEq Oral Given 05/27/23 0248)     IMPRESSION / MDM / ASSESSMENT AND PLAN / ED COURSE  I reviewed the triage vital signs and the nursing notes.  Differential diagnosis includes, but is not limited to, threatened abortion, completed miscarriage, subchorionic hemorrhage  Patient's presentation is most consistent with acute presentation with potential threat to life or bodily function.  37 year old female presenting to the emergency department for evaluation of vaginal bleeding in pregnancy.  Labs with stable hemoglobin.  Mild hypokalemia, orally repleted.  Low beta-hCG for gestational age at 92.  Ultrasound unfortunately demonstrates pregnancy without fetal heart tones concerning for miscarriage.   Case was reviewed with Jazmine Liboon with [facility].  She did recommend that the patient arrange close outpatient  follow-up for further discussion of management, but did feel that patient could be discharged from the ER if her bleeding was controlled.  Patient reports only mild vaginal spotting.  She was offered a pelvic exam but declined which I do think is reasonable as she does not appear to have significant hemorrhage.  Patient does have B- blood type.  With her vaginal bleeding, I did recommend RhoGAM, but patient understandably upset after receiving news of her ultrasound findings.  She would like to be discharged now and contact the clinic to get her RhoGAM treatment.  I did discuss that I cannot guarantee how fast she will be able to get into the clinic, but patient continues to decline RhoGAM here.  Strict return precautions including bleeding precautions were provided.  Patient was discharged in stable condition.    FINAL CLINICAL IMPRESSION(S) / ED DIAGNOSES   Final diagnoses:  Vaginal bleeding in pregnancy  Hypokalemia     Rx / DC Orders   ED Discharge Orders     None        Note:  This document was prepared using Dragon voice recognition software and may include unintentional dictation errors.   Trinna Post, MD 05/27/23 [phone]

## 2023-05-29 LAB — TYPE AND SCREEN
ABO/RH(D): B NEG
Antibody Screen: POSITIVE

## 2023-07-20 NOTE — L&D Delivery Note (Signed)
 Delivery Note At 5:10 PM a viable female was delivered via Vaginal, Spontaneous (Presentation: Right Occiput Anterior).  APGAR: 8, 9; weight  pending.   Placenta status: spontaneously, intact.  Cord: 3 vessels with the following complications: None.  Cord pH: n/a  Anesthesia: None Episiotomy: None Lacerations: None Suture Repair: n/a Est. Blood Loss (mL): 200  Mom to postpartum.  Baby to Couplet care / Skin to Skin.  Called to see patient.  Mom pushed to deliver a viable female infant.  The head followed by shoulders, which delivered without difficulty, and the rest of the body.  A single body cord noted and not reduced. A compound presentation of arm and head.  Baby to mom's chest.  Cord clamped and cut after > 1 min delay.  Cord blood obtained.  Placenta delivered spontaneously, intact, with a 3-vessel cord.  No vaginal, cervical, or perineal lacerations. All counts correct.  Hemostasis obtained with IV pitocin  and fundal massage. EBL 200 mL.     Garnette Mace, MD 06/05/2024, 5:27 PM

## 2023-10-24 ENCOUNTER — Ambulatory Visit (INDEPENDENT_AMBULATORY_CARE_PROVIDER_SITE_OTHER): Payer: Self-pay | Admitting: Oncology

## 2023-10-24 DIAGNOSIS — R519 Headache, unspecified: Secondary | ICD-10-CM

## 2023-10-24 LAB — POC SOFIA 2 FLU + SARS ANTIGEN FIA
Influenza A, POC: POSITIVE — AB
Influenza B, POC: NEGATIVE
SARS Coronavirus 2 Ag: NEGATIVE

## 2023-10-24 MED ORDER — OSELTAMIVIR PHOSPHATE 75 MG PO CAPS: 75.0000 mg | ORAL_CAPSULE | Freq: Two times a day (BID) | ORAL | 0 refills | Status: DC

## 2023-10-24 NOTE — Progress Notes (Unsigned)
 [facility]  [address] [place], [place] [address] Phone: [phone] Fax: [phone]   Office Visit Note  Patient Name: Candice Hoover  Date of ZDGLO:[id number]  Med Rec number [id number]  Date of Service: 10/25/2023  Patient has no known allergies.  No chief complaint on file.  HPI Patient is an 38 y.o. who is here runny nose, nasal congestion, diarrhea, chills, nausea and headache that started this morning. Mild chest pain/neck pain with swollen LN in her neck. Has been having intermittent chest pain that starts on her left side and moves to her right side.  No heart palpitations.  Occasional shortness of breath when she coughs.  Husband reports a low BP reading this morning of 89/61 with a heart rate of 106.  Her appetite has also been low.  She did not get the flu shot this year.  She has two young kids at home that both were very cranky this weekend with nasal congestion. Has been taking OTC tylenol. Helped some with HA.  Low-grade temperature of 99.9 this morning.  NKDA. No recent antibiotics.  Current Medication:  Outpatient Encounter Medications as of 10/24/2023  Medication Sig   oseltamivir (TAMIFLU) 75 MG capsule Take 1 capsule (75 mg total) by mouth 2 (two) times daily.   Cholecalciferol 25 MCG (1000 UT) capsule Take by mouth.   cyanocobalamin (VITAMIN B12) 1000 MCG tablet Take by mouth.   omega-3 acid ethyl esters (LOVAZA) 1 g capsule Take by mouth.   No facility-administered encounter medications on file as of 10/24/2023.    Medical History: Past Medical History:  Diagnosis Date   Rh negative state in antepartum period    Thyroid disease    Varicella     Vital Signs: LMP 02/26/2023   ROS: As per HPI.  All other pertinent ROS negative.     Review of Systems  Constitutional:  Positive for appetite change, chills, diaphoresis and fatigue.  HENT:  Positive for congestion, postnasal drip, rhinorrhea, sinus pressure and sinus pain. Negative for  ear pain and sore throat.   Respiratory:  Positive for cough and shortness of breath.   Cardiovascular:  Positive for chest pain.  Gastrointestinal:  Positive for nausea. Negative for constipation, diarrhea and vomiting.  Genitourinary:  Negative for menstrual problem.  Musculoskeletal:  Positive for myalgias.  Skin:  Negative for rash.  Allergic/Immunologic: Positive for environmental allergies.  Neurological:  Positive for headaches. Negative for dizziness.  Hematological:  Positive for adenopathy.  Psychiatric/Behavioral:  The patient is not nervous/anxious.     Physical Exam Vitals reviewed.  Constitutional:      Appearance: Normal appearance.  HENT:     Right Ear: A middle ear effusion is present.     Left Ear: A middle ear effusion is present.     Nose: Mucosal edema, congestion and rhinorrhea present.     Right Turbinates: Swollen.     Left Turbinates: Swollen.     Right Sinus: Maxillary sinus tenderness and frontal sinus tenderness present.     Left Sinus: Maxillary sinus tenderness and frontal sinus tenderness present.     Mouth/Throat:     Pharynx: Posterior oropharyngeal erythema and postnasal drip present.     Tonsils: No tonsillar exudate. 0 on the right. 0 on the left.  Cardiovascular:     Rate and Rhythm: Normal rate and regular rhythm.     Heart sounds: Normal heart sounds.  Pulmonary:     Effort: Pulmonary effort is normal.  Breath sounds: Normal breath sounds.  Lymphadenopathy:     Cervical: Cervical adenopathy present.  Neurological:     Mental Status: She is alert.     Results for orders placed or performed in visit on 10/24/23 (from the past 24 hours)  POC SOFIA 2 FLU + SARS ANTIGEN FIA     Status: Abnormal   Collection Time: 10/24/23  3:41 PM  Result Value Ref Range   Influenza A, POC Positive (A) Negative   Influenza B, POC Negative Negative   SARS Coronavirus 2 Ag Negative Negative    Assessment/Plan: 1. Nonintractable headache,  unspecified chronicity pattern, unspecified headache type (Primary) Influenza A positive. Discussed Potential benefit and adverse effects of Tamiflu.  Patient elected to take treatment.  Encourage supportive measures (fluids, rest cough drops, tea) and over the counter meds (I.e. analgesic/anti-inflammatory, decongestant, antihistamine, cough suppressant)  as needed for symptom relief.  Discussed infection control measures to prevent spread of infection.  Advised she should not return to class until afebrile (100.4 or above) for 24 hours.  Advised patient to send me a message in MyChart if continue to have fever.  Return to clinic as needed for new/worsening symptoms (I.e. shortness of breath) or if symptoms are not resolving as expected over the next 5-7 days.    - POC SOFIA 2 FLU + SARS ANTIGEN FIA   General Counseling: Candice Hoover verbalizes understanding of the findings of todays visit and agrees with plan of treatment. I have discussed any further diagnostic evaluation that may be needed or ordered today. We also reviewed her medications today. she has been encouraged to call the office with any questions or concerns that should arise related to todays visit.   Orders Placed This Encounter  Procedures   POC SOFIA 2 FLU + SARS ANTIGEN FIA    Meds ordered this encounter  Medications   oseltamivir (TAMIFLU) 75 MG capsule    Sig: Take 1 capsule (75 mg total) by mouth 2 (two) times daily.    Dispense:  10 capsule    Refill:  0   I spent 20 minutes dedicated to the care of this patient (face-to-face and non-face-to-face) on the date of the encounter to include what is described in the assessment and plan.   Durenda Hurt, NP 10/25/2023 1:59 PM

## 2023-11-15 LAB — OB RESULTS CONSOLE HEPATITIS B SURFACE ANTIGEN: Hepatitis B Surface Ag: NEGATIVE

## 2023-11-15 LAB — OB RESULTS CONSOLE VARICELLA ZOSTER ANTIBODY, IGG: Varicella: IMMUNE

## 2023-11-15 LAB — OB RESULTS CONSOLE RUBELLA ANTIBODY, IGM: Rubella: IMMUNE

## 2024-03-13 LAB — OB RESULTS CONSOLE HIV ANTIBODY (ROUTINE TESTING): HIV: NONREACTIVE

## 2024-03-13 LAB — OB RESULTS CONSOLE RPR: RPR: NONREACTIVE

## 2024-03-21 ENCOUNTER — Encounter: Payer: Self-pay | Admitting: Adult Health

## 2024-03-21 ENCOUNTER — Ambulatory Visit (INDEPENDENT_AMBULATORY_CARE_PROVIDER_SITE_OTHER): Payer: Self-pay | Admitting: Adult Health

## 2024-03-21 ENCOUNTER — Other Ambulatory Visit: Payer: Self-pay

## 2024-03-21 VITALS — BP 90/72 | HR 99 | Temp 97.0°F | Ht 63.5 in | Wt 128.0 lb

## 2024-03-21 DIAGNOSIS — J029 Acute pharyngitis, unspecified: Secondary | ICD-10-CM

## 2024-03-21 LAB — POCT RAPID STREP A (OFFICE): Rapid Strep A Screen: NEGATIVE

## 2024-03-21 NOTE — Progress Notes (Signed)
 [facility] [address] [place], [place] [address]   Office Visit Note  Patient Name: Candice Hoover Date of Birth [id number]  Medical Record number [id number]  Date of Service: 03/21/2024  Chief Complaint  Patient presents with   Sore Throat    Sore throat and postnasal drainage x 3 days. Patient states she had nasal congestion prior to the drainage. Denies fever. Symptoms improve with drinking and eating. She is [redacted] weeks pregnant.      Sore Throat  Associated symptoms include congestion. Pertinent negatives include no coughing, diarrhea or vomiting.   Pt is here for a sick visit. Pt reports she started having runny nose 2-3 days ago.  She has also had congestion, and sore throat. Her daughter was sick last week.  She is currently [redacted] weeks pregnant.    Current Medication:  Outpatient Encounter Medications as of 03/21/2024  Medication Sig   Calcium-Vitamin D-Iron (CALCIUM 600 IRON/D PO) Take 1 tablet by mouth daily.   omega-3 acid ethyl esters (LOVAZA) 1 g capsule Take by mouth.   Prenatal Vit-Fe Fumarate-FA (PRENATAL MULTIVITAMIN) TABS tablet Take 1 tablet by mouth daily at 12 noon.   [DISCONTINUED] Cholecalciferol 25 MCG (1000 UT) capsule Take by mouth.   [DISCONTINUED] cyanocobalamin (VITAMIN B12) 1000 MCG tablet Take by mouth.   [DISCONTINUED] oseltamivir  (TAMIFLU ) 75 MG capsule Take 1 capsule (75 mg total) by mouth 2 (two) times daily.   No facility-administered encounter medications on file as of 03/21/2024.      Medical History: Past Medical History:  Diagnosis Date   Rh negative state in antepartum period    Thyroid  disease    Varicella      Vital Signs: BP 90/72   Pulse 99   Temp (!) 97 F (36.1 C)   Ht 5' 3.5 (1.613 m)   Wt 128 lb (58.1 kg)   SpO2 98%   BMI 22.32 kg/m    Review of Systems  Constitutional:  Negative for chills, fatigue and fever.  HENT:  Positive for congestion, rhinorrhea and sore throat.   Eyes:  Negative for pain and  itching.  Respiratory:  Negative for cough.   Cardiovascular:  Negative for chest pain.  Gastrointestinal:  Negative for diarrhea, nausea and vomiting.    Physical Exam HENT:     Head: Normocephalic.     Right Ear: Tympanic membrane and ear canal normal.     Left Ear: Tympanic membrane and ear canal normal.     Nose: Nose normal.     Mouth/Throat:     Mouth: Mucous membranes are moist.  Eyes:     Pupils: Pupils are equal, round, and reactive to light.  Pulmonary:     Effort: Pulmonary effort is normal.  Lymphadenopathy:     Cervical: No cervical adenopathy.  Neurological:     Mental Status: She is alert.    Results for orders placed or performed in visit on 03/21/24 (from the past 24 hours)  POCT rapid strep A     Status: None   Collection Time: 03/21/24 11:19 AM  Result Value Ref Range   Rapid Strep A Screen Negative Negative    Assessment/Plan: 1. Sore throat (Primary) Continue current symptom management.  Follow up via MyChart messenger if symptoms fail to improve or may return to clinic as needed for worsening symptoms.   - POCT rapid strep A     General Counseling: Candice Hoover verbalizes understanding of the findings of todays visit and agrees with plan  of treatment. I have discussed any further diagnostic evaluation that may be needed or ordered today. We also reviewed her medications today. she has been encouraged to call the office with any questions or concerns that should arise related to todays visit.   Orders Placed This Encounter  Procedures   POCT rapid strep A    No orders of the defined types were placed in this encounter.   Time spent:15 Minutes    Candice Hoover AGNP-C Nurse Practitioner

## 2024-05-10 LAB — OB RESULTS CONSOLE GC/CHLAMYDIA
Chlamydia: NEGATIVE
Neisseria Gonorrhea: NEGATIVE

## 2024-05-10 LAB — OB RESULTS CONSOLE GBS: GBS: NEGATIVE

## 2024-06-01 NOTE — Progress Notes (Signed)
 Obstetrics & Gynecology Office Visit  Subjective  Candice Hoover is a 38 y.o. [phone] at [redacted]w[redacted]d being seen today for ongoing prenatal care.  She is currently monitored for tthis high-risk pregnancy.  Patient's last menstrual period was 08/30/2023. Estimated Date of Delivery: 06/05/24  History of Present Illness Candice Hoover is a 38 year old female who presents for routine prenatal appointment.   She is experiencing swollen legs and a headache that began yesterday. She has not taken any medications yet for her headache.   A couple of days ago, she felt very cold at night, took a shower, and experienced diarrhea. She felt better the next morning and did not have a fever.  She is concerned about the size of her baby, as previous ultrasounds indicated the baby is measuring large, in the 99th percentile at 35 weeks. She has had large babies in the past, with her previous children weighing around 8lbs 6oz and 7lbs 12 oz at birth.     Pt denies contractions, vaginal bleeding, leaking fluid. Endorses good fetal movement Pt denies HA, VD or RUQ pain.   Objective  BP 122/77   Pulse 80   Ht 160 cm (5' 3)   Wt 63.5 kg (140 lb)   LMP 08/30/2023   BMI 24.80 kg/m    Fetal Status: Fetal Heart Rate: 135 bpm Fundal Height (cm): 39 cm Movement: Present  Presentation: Cephalic  PE Chaperone note: A chaperone was included for sensitive portions of the exam- staff member name: Glenys Alstrom, CMA Chaperone present for pelvic exam.  Gen: NAD  Pulm: No use of accessory muscles, normal respirations Abdomen: Gravid, nontender Ext : No edema, no rashes.   Psych: Mood, insight, judgement intact SVE: 1/60/-1/soft/mid Fundal height: S=D  Assessment   38 y.o. [id number] at 109w3d by  06/05/2024, by Last Menstrual Period presenting for routine prenatal visit  The primary encounter diagnosis was Supervision of high risk pregnancy in third trimester (HHS-HCC). A diagnosis of AMA (advanced maternal  age) multigravida 35+, third trimester (HHS-HCC) was also pertinent to this visit.   Plan   Problem list reviewed and/or updated   Assessment & Plan Supervision of high risk pregnancy, third trimester Discussed risk and benefits of induction of labor. Enrique desires to schedule induction. We also discussed risk and benefits of membrane sweep.  - Performed membrane sweep - Induction of labor scheduled for 06/05/2024 at 0800  - Provided information on induction process and expectations. - Advised to call at 7 AM on induction day to confirm availability. - Instructed to monitor for strong contractions, water breaking, heavy bleeding, or decreased fetal movement and come to the hospital if these occur.  Headache and lower extremity swelling in pregnancy Headache started yesterday, has not tried home treatments for it. No lower extremity swelling present. Blood pressure slightly elevated but safe. No signs of preeclampsia or hypertension. Discussed normal blood pressure fluctuations and when to seek attention. - Advised taking Tylenol  for headache relief.  GBS negative. Kick counts reviewed with the patient in detail.  Patient instructed to assess for fetal activity daily at regular intervals.  Counts to be done if decreased activity noted.  Patient instructed to contact the office if counts do not reveal adequate movements. Labor precautions (ROM, vaginal bleeding, s/s labor) reviewed.  Pt verbalized understanding.    Return as needed prior to induction of labor on 06/05/2024.   Attestation Statement:   I personally performed the service, non-incident to. (WP)   ANNA ROSALINE PILLOW, CNM  This note has been created using automated tools and reviewed for accuracy by ANNA MICHELLE MACKIE.

## 2024-06-04 ENCOUNTER — Observation Stay
Admission: EM | Admit: 2024-06-04 | Discharge: 2024-06-04 | Disposition: A | Discharge status: Home / Self Care | Source: Ambulatory Visit | Admitting: Obstetrics and Gynecology

## 2024-06-04 ENCOUNTER — Encounter: Payer: Self-pay | Admitting: Obstetrics and Gynecology

## 2024-06-04 ENCOUNTER — Other Ambulatory Visit: Payer: Self-pay

## 2024-06-04 DIAGNOSIS — Z3A39 39 weeks gestation of pregnancy: Secondary | ICD-10-CM | POA: Insufficient documentation

## 2024-06-04 DIAGNOSIS — O26893 Other specified pregnancy related conditions, third trimester: Secondary | ICD-10-CM | POA: Diagnosis not present

## 2024-06-04 DIAGNOSIS — O09893 Supervision of other high risk pregnancies, third trimester: Principal | ICD-10-CM | POA: Insufficient documentation

## 2024-06-04 MED ORDER — ACETAMINOPHEN 500 MG PO TABS: 1000.0000 mg | ORAL_TABLET | Freq: Four times a day (QID) | ORAL | Status: DC | PRN

## 2024-06-04 MED ORDER — CALCIUM CARBONATE ANTACID 500 MG PO CHEW: 2.0000 | CHEWABLE_TABLET | ORAL | Status: DC | PRN

## 2024-06-04 NOTE — Discharge Instructions (Signed)
LABOR: When contractions begin, you should start to time them from the beginning of one contraction to the beginning of the next.  When contractions are 5-10 minutes apart or less and have been regular for at least an hour, you should call your health care provider. ° °Notify your doctor if any of the following occur: °1. Bleeding from the vagina 7. Sudden, constant, or occasional abdominal pain  °2. Pain or burning when urinating 8. Sudden gushing of fluid from the vagina (with or without continued leaking)  °3. Chills or fever 9. Fainting spells, "black outs" or loss of consciousness  °4. Increase in vaginal discharge 10. Severe or continued nausea or vomiting  °5. Pelvic pressure (sudden increase) 11. Blurring of vision or spots before the eyes  °6. Baby moving less than usual 12. Leaking of fluid  ° ° °FETAL KICK COUNT: °Lie on your left side for one hour after a meal, and count the number of times your baby kicks. If it is less than 5 times, get up, move around and drink some juice. Repeat the test 30 minutes later. If it is still less than 5 kicks in an hour, notify your doctor. °

## 2024-06-04 NOTE — Discharge Summary (Signed)
 Candice Hoover is a 38 y.o. female. She is at [redacted]w[redacted]d gestation. Patient's last menstrual period was 08/30/2023. 06/05/2024, by Last Menstrual Period   Prenatal care site: [facility]  Chief complaint: 39 week IUP   Admission Diagnoses:  1) intrauterine pregnancy at [redacted]w[redacted]d  2) [redacted] weeks gestation of pregnancy [Z3A.39]  Discharge Diagnoses:  Principal Problem:   [redacted] weeks gestation of pregnancy   HPI: Candice Hoover presents to L&D with vague complaints. She reports that she has felt unwell most of her pregnancy and the last couple of days she as started to feel better and almost normal. Reports this happened with her previous pregnancy when she had a loss at 12 weeks. This made her feel concerned.  Her pregnancy is complicated by AMA.  She denies Contractions, Loss of fluid, or Vaginal bleeding. Endorses fetal movement as active.   S: Resting comfortably. no CTX, no VB.no LOF,  Active fetal movement.   Maternal Medical History:  Past Medical Hx:  has a past medical history of Rh negative state in antepartum period, Thyroid  disease, and Varicella.    Past Surgical Hx:  has a past surgical history that includes No past surgeries.   No Known Allergies   Prior to Admission medications   Medication Sig Start Date End Date Taking? Authorizing Provider  Calcium-Vitamin D-Iron (CALCIUM 600 IRON/D PO) Take 1 tablet by mouth daily.   Yes [provider]  omega-3 acid ethyl esters (LOVAZA) 1 g capsule Take by mouth.   Yes [provider]  Prenatal Vit-Fe Fumarate-FA (PRENATAL MULTIVITAMIN) TABS tablet Take 1 tablet by mouth daily at 12 noon.   Yes [provider]    Social History: She  reports that she has never smoked. She has never used smokeless tobacco. She reports that she does not currently use alcohol. She reports that she does not use drugs.  Family History: family history includes Thyroid  disease in her sister.   Review of Systems: A full review of  systems was performed and negative except as noted in the HPI.     Pertinent Results:   O:  BP 119/77 (BP Location: Left Arm)   Pulse 72   Temp 97.8 F (36.6 C) (Oral)   Resp 16   Ht 5' 3.5 (1.613 m)   Wt 63.5 kg   LMP 08/30/2023   BMI 24.41 kg/m  No results found for this or any previous visit (from the past 48 hours).  No results found.  Constitutional: NAD, AAOx3  PULM: nl respiratory effort Abd: gravid, non-tender Ext: Non-tender, Nonedmeatous Psych: mood appropriate, speech normal Pelvic : deferred  NST: Baseline FHR: 125 beats/min Variability: moderate Accelerations: present Decelerations: absent Tocometry: Irregular, mild contractions  Time: at least 20 minutes   Interpretation: Category I INDICATIONS: patient reassurance RESULTS:  A NST procedure was performed with FHR monitoring and a normal baseline established, appropriate time of 20-40 minutes of evaluation, and accels >2 seen w 15x15 characteristics.  Results show a REACTIVE NST.   Consults: None  Procedures: NST  Hospital Course: The patient was admitted to Labor and Delivery Triage for observation. NST was reactive and BP's WNL. Reassurance provided. Instructed to return tomorrow for scheduled induction of labor. Reviewed warning signs to return to L&D with.   She was deemed stable for discharge and further outpatient management.   Discharge Condition: stable  Disposition: Discharge disposition: 01-Home or Self Care       Allergies as of 06/04/2024   No Known Allergies  Medication List     TAKE these medications    CALCIUM 600 IRON/D PO Take 1 tablet by mouth daily.   omega-3 acid ethyl esters 1 g capsule Commonly known as: LOVAZA Take by mouth.   prenatal multivitamin Tabs tablet Take 1 tablet by mouth daily at 12 noon.        Follow-up Information     [facility]. Go in 1 day(s).   Specialty: Obstetrics and Gynecology Why:  scheduled induction of labor Contact information: [address] [place]  [address] [phone]               ----- Therisa CHRISTELLA Pillow, CNM  Certified Nurse Midwife [facility]

## 2024-06-04 NOTE — OB Triage Note (Signed)

## 2024-06-04 NOTE — OB Triage Note (Signed)
 Pt reports to labor and delivery with complaints of DFM. States that she has felt unwell most of her pregnancy and within the last couple of days she has felt better and almost normal. When she had a 12 week loss, she had a similar experience - feeling unwell until the loss. Denies vaginal bleeding and LOF. States positive fetal movement, but less large movements.   EFM and toco applied and assessing.

## 2024-06-05 ENCOUNTER — Other Ambulatory Visit: Payer: Self-pay

## 2024-06-05 ENCOUNTER — Inpatient Hospital Stay
Admission: RE | Admit: 2024-06-05 | Discharge: 2024-06-06 | DRG: 807 | Disposition: A | Discharge status: Home / Self Care | Attending: Obstetrics and Gynecology | Admitting: Obstetrics and Gynecology

## 2024-06-05 ENCOUNTER — Encounter: Payer: Self-pay | Admitting: Obstetrics and Gynecology

## 2024-06-05 DIAGNOSIS — Z6721 Type B blood, Rh negative: Secondary | ICD-10-CM | POA: Diagnosis present

## 2024-06-05 DIAGNOSIS — Z8632 Personal history of gestational diabetes: Secondary | ICD-10-CM | POA: Diagnosis present

## 2024-06-05 DIAGNOSIS — O26893 Other specified pregnancy related conditions, third trimester: Secondary | ICD-10-CM | POA: Diagnosis present

## 2024-06-05 DIAGNOSIS — Z3A40 40 weeks gestation of pregnancy: Secondary | ICD-10-CM | POA: Diagnosis not present

## 2024-06-05 DIAGNOSIS — O326XX0 Maternal care for compound presentation, not applicable or unspecified: Secondary | ICD-10-CM | POA: Diagnosis present

## 2024-06-05 DIAGNOSIS — Z6791 Unspecified blood type, Rh negative: Secondary | ICD-10-CM | POA: Diagnosis not present

## 2024-06-05 DIAGNOSIS — O09529 Supervision of elderly multigravida, unspecified trimester: Secondary | ICD-10-CM

## 2024-06-05 LAB — CBC
HCT: 38.5 % (ref 36.0–46.0)
Hemoglobin: 13.2 g/dL (ref 12.0–15.0)
MCH: 30.8 pg (ref 26.0–34.0)
MCHC: 34.3 g/dL (ref 30.0–36.0)
MCV: 90 fL (ref 80.0–100.0)
Platelets: 163 K/uL (ref 150–400)
RBC: 4.28 MIL/uL (ref 3.87–5.11)
RDW: 14.3 % (ref 11.5–15.5)
WBC: 8.7 K/uL (ref 4.0–10.5)
nRBC: 0 % (ref 0.0–0.2)

## 2024-06-05 LAB — TYPE AND SCREEN
ABO/RH(D): B NEG
Antibody Screen: POSITIVE

## 2024-06-05 MED ORDER — LIDOCAINE HCL (PF) 1 % IJ SOLN
INTRAMUSCULAR | Status: AC
  Filled 2024-06-05: qty 30

## 2024-06-05 MED ORDER — IBUPROFEN 600 MG PO TABS
600.0000 mg | ORAL_TABLET | Freq: Four times a day (QID) | ORAL | Status: DC
  Administered 2024-06-06: 600 mg via ORAL
  Filled 2024-06-05 (×3): qty 1

## 2024-06-05 MED ORDER — TETANUS-DIPHTH-ACELL PERTUSSIS 5-2-15.5 LF-MCG/0.5 IM SUSP
0.5000 mL | Freq: Once | INTRAMUSCULAR | Status: DC
  Filled 2024-06-05: qty 0.5

## 2024-06-05 MED ORDER — OXYTOCIN-SODIUM CHLORIDE 30-0.9 UT/500ML-% IV SOLN
1.0000 m[IU]/min | INTRAVENOUS | Status: DC
  Administered 2024-06-05: 2 m[IU]/min via INTRAVENOUS

## 2024-06-05 MED ORDER — LACTATED RINGERS IV SOLN: INTRAVENOUS | Status: DC

## 2024-06-05 MED ORDER — DIBUCAINE (PERIANAL) 1 % EX OINT: 1.0000 | TOPICAL_OINTMENT | CUTANEOUS | Status: DC | PRN

## 2024-06-05 MED ORDER — TERBUTALINE SULFATE 1 MG/ML IJ SOLN: 0.2500 mg | Freq: Once | INTRAMUSCULAR | Status: DC | PRN

## 2024-06-05 MED ORDER — ACETAMINOPHEN 325 MG PO TABS
650.0000 mg | ORAL_TABLET | ORAL | Status: DC | PRN
Start: 2024-06-05 — End: 2024-06-06
  Filled 2024-06-05: qty 2

## 2024-06-05 MED ORDER — FENTANYL CITRATE (PF) 100 MCG/2ML IJ SOLN: 50.0000 ug | INTRAMUSCULAR | Status: DC | PRN

## 2024-06-05 MED ORDER — SIMETHICONE 80 MG PO CHEW: 80.0000 mg | CHEWABLE_TABLET | ORAL | Status: DC | PRN

## 2024-06-05 MED ORDER — ONDANSETRON HCL 4 MG PO TABS: 4.0000 mg | ORAL_TABLET | ORAL | Status: DC | PRN

## 2024-06-05 MED ORDER — LIDOCAINE HCL (PF) 1 % IJ SOLN: 30.0000 mL | INTRAMUSCULAR | Status: DC | PRN

## 2024-06-05 MED ORDER — OXYTOCIN 10 UNIT/ML IJ SOLN
INTRAMUSCULAR | Status: AC
  Filled 2024-06-05: qty 2

## 2024-06-05 MED ORDER — SOD CITRATE-CITRIC ACID 500-334 MG/5ML PO SOLN: 30.0000 mL | ORAL | Status: DC | PRN

## 2024-06-05 MED ORDER — ONDANSETRON HCL 4 MG/2ML IJ SOLN: 4.0000 mg | Freq: Four times a day (QID) | INTRAMUSCULAR | Status: DC | PRN

## 2024-06-05 MED ORDER — AMMONIA AROMATIC IN INHA
RESPIRATORY_TRACT | Status: AC
  Filled 2024-06-05: qty 10

## 2024-06-05 MED ORDER — BENZOCAINE-MENTHOL 20-0.5 % EX AERO
1.0000 | INHALATION_SPRAY | CUTANEOUS | Status: DC | PRN
  Filled 2024-06-05: qty 56

## 2024-06-05 MED ORDER — LACTATED RINGERS IV SOLN: 500.0000 mL | INTRAVENOUS | Status: DC | PRN

## 2024-06-05 MED ORDER — OXYCODONE-ACETAMINOPHEN 5-325 MG PO TABS: 1.0000 | ORAL_TABLET | ORAL | Status: DC | PRN

## 2024-06-05 MED ORDER — OXYCODONE-ACETAMINOPHEN 5-325 MG PO TABS: 2.0000 | ORAL_TABLET | ORAL | Status: DC | PRN

## 2024-06-05 MED ORDER — ONDANSETRON HCL 4 MG/2ML IJ SOLN: 4.0000 mg | INTRAMUSCULAR | Status: DC | PRN

## 2024-06-05 MED ORDER — COCONUT OIL OIL
1.0000 | TOPICAL_OIL | Status: DC | PRN
  Filled 2024-06-05: qty 7.5

## 2024-06-05 MED ORDER — OXYTOCIN BOLUS FROM INFUSION
333.0000 mL | Freq: Once | INTRAVENOUS | Status: AC
  Administered 2024-06-05: 333 mL via INTRAVENOUS

## 2024-06-05 MED ORDER — ACETAMINOPHEN 325 MG PO TABS: 650.0000 mg | ORAL_TABLET | ORAL | Status: DC | PRN

## 2024-06-05 MED ORDER — DIPHENHYDRAMINE HCL 25 MG PO CAPS: 25.0000 mg | ORAL_CAPSULE | Freq: Four times a day (QID) | ORAL | Status: DC | PRN

## 2024-06-05 MED ORDER — MISOPROSTOL 200 MCG PO TABS
ORAL_TABLET | ORAL | Status: AC
  Filled 2024-06-05: qty 4

## 2024-06-05 MED ORDER — OXYTOCIN-SODIUM CHLORIDE 30-0.9 UT/500ML-% IV SOLN
2.5000 [IU]/h | INTRAVENOUS | Status: DC
  Filled 2024-06-05 (×2): qty 500

## 2024-06-05 MED ORDER — PRENATAL MULTIVITAMIN CH
1.0000 | ORAL_TABLET | Freq: Every day | ORAL | Status: DC
  Administered 2024-06-06: 1 via ORAL
  Filled 2024-06-05 (×2): qty 1

## 2024-06-05 MED ORDER — WITCH HAZEL-GLYCERIN EX PADS: 1.0000 | MEDICATED_PAD | CUTANEOUS | Status: DC | PRN

## 2024-06-05 MED ORDER — MISOPROSTOL 25 MCG QUARTER TABLET
25.0000 ug | ORAL_TABLET | ORAL | Status: DC | PRN
  Administered 2024-06-05: 25 ug via VAGINAL
  Filled 2024-06-05: qty 1

## 2024-06-05 MED ORDER — SENNOSIDES-DOCUSATE SODIUM 8.6-50 MG PO TABS: 2.0000 | ORAL_TABLET | ORAL | Status: DC

## 2024-06-05 MED ORDER — FERROUS SULFATE 325 (65 FE) MG PO TABS
325.0000 mg | ORAL_TABLET | Freq: Two times a day (BID) | ORAL | Status: DC
  Administered 2024-06-06: 325 mg via ORAL
  Filled 2024-06-05: qty 1

## 2024-06-05 NOTE — Discharge Summary (Signed)
 Postpartum Discharge Summary  Patient Name: Candice Hoover DOB: 07-04-1986 MRN: [id number]  Date of admission: 06/05/2024 Delivery date:06/05/2024 Delivering provider: JACKSON, STEPHEN D Date of discharge: 06/06/2024  Primary OB: [facility] OFE:Ejupzwu'd last menstrual period was 08/30/2023. EDC Estimated Date of Delivery: 06/05/24 Gestational Age at Delivery: [redacted]w[redacted]d   Admitting diagnosis: Advanced maternal age in multigravida [O09.529] Intrauterine pregnancy: [redacted]w[redacted]d     Secondary diagnosis:   Principal Problem:   NSVD (normal spontaneous vaginal delivery) Active Problems:   History of gestational diabetes   Type B blood, Rh negative   Advanced maternal age in multigravida   [redacted] weeks gestation of pregnancy   Discharge Diagnosis: Term Pregnancy Delivered and [facility]      Hospital course: Induction of Labor With Vaginal Delivery   38 y.o. yo [id number] at [redacted]w[redacted]d was admitted to the hospital 06/05/2024 for induction of labor.  Indication for induction: AMA.  Patient had an labor course complicated by none. Membrane Rupture Time/Date: 4:20 PM,06/05/2024  Delivery Method:Vaginal, Spontaneous Operative Delivery:N/A Episiotomy: None Lacerations:  None Details of delivery can be found in separate delivery note.  Patient had an uncomplicated postpartum course. Patient is discharged home 06/06/24.  Newborn Data: Birth date:06/05/2024 Birth time:5:10 PM Gender:Female Living status:Living Apgars:8 ,9  Weight:3890 g                                            Post partum procedures:rhogam Induction:: Pitocin  and Cytotec  Complications: None Delivery Type: spontaneous vaginal delivery Anesthesia: non-pharmacological methods Placenta: spontaneous To Pathology: No   Prenatal Labs:  Blood type/Rh B neg  Antibody screen neg  Rubella Immune  Varicella Immune  RPR NR  HBsAg Neg  HIV NR  GC neg  Chlamydia neg  Genetic screening negative  1 hour GTT 120  3 hour GTT    GBS  Neg    Magnesium Sulfate received: No BMZ received: No Rhophylac :was given MMR: Declined prenatally  Varivax vaccine given: was not indicated T-DaP: Declined prenatally  Flu: Declined prenatally   Transfusion:No  Physical exam  Vitals:   06/05/24 2200 06/06/24 0049 06/06/24 0427 06/06/24 0812  BP: 125/76 120/86 114/76 109/84  Pulse: 70 70 64 68  Resp: 17 18 17 14   Temp: (!) 97.5 F (36.4 C) 98.1 F (36.7 C) (!) 97.5 F (36.4 C) 97.6 F (36.4 C)  TempSrc: Oral Oral Oral Oral  SpO2: 99% 98% 98% 97%  Weight:      Height:       General: alert and cooperative Lochia: appropriate Uterine Fundus: firm Perineum:minimal edema/intact DVT Evaluation: No evidence of DVT seen on physical exam. Negative Homan's sign. No cords or calf tenderness. No significant calf/ankle edema.  Labs: Lab Results  Component Value Date   WBC 11.4 (H) 06/06/2024   HGB 12.1 06/06/2024   HCT 36.4 06/06/2024   MCV 91.5 06/06/2024   PLT 161 06/06/2024      Latest Ref Rng & Units 05/26/2023    9:12 PM  CMP  Glucose 70 - 99 mg/dL 860   BUN 6 - 20 mg/dL 21   Creatinine 9.55 - 1.00 mg/dL 9.19   Sodium 864 - 854 mmol/L 133   Potassium 3.5 - 5.1 mmol/L 3.1   Chloride 98 - 111 mmol/L 98   CO2 22 - 32 mmol/L 23   Calcium 8.9 - 10.3 mg/dL 9.2   Total Protein 6.5 -  8.1 g/dL 7.5   Total Bilirubin <8.7 mg/dL 0.8   Alkaline Phos 38 - 126 U/L 53   AST 15 - 41 U/L 16   ALT 0 - 44 U/L 17    Edinburgh Score:    06/06/2024    8:30 AM  Edinburgh Postnatal Depression Scale Screening Tool  I have been able to laugh and see the funny side of things. 0  I have looked forward with enjoyment to things. 0  I have blamed myself unnecessarily when things went wrong. 1  I have been anxious or worried for no good reason. 2  I have felt scared or panicky for no good reason. 0  Things have been getting on top of me. 0  I have been so unhappy that I have had difficulty sleeping. 0  I have felt sad or  miserable. 0  I have been so unhappy that I have been crying. 0  The thought of harming myself has occurred to me. 0  Edinburgh Postnatal Depression Scale Total 3    Risk assessment for postpartum VTE and prophylactic treatment: Very high risk factors: None High risk factors: None Moderate risk factors: None  Postpartum VTE prophylaxis with LMWH not indicated  After visit meds:  Allergies as of 06/06/2024   No Known Allergies      Medication List     TAKE these medications    acetaminophen  325 MG tablet Commonly known as: Tylenol  Take 2 tablets (650 mg total) by mouth every 4 (four) hours as needed (for pain scale < 4).   benzocaine -Menthol  20-0.5 % Aero Commonly known as: DERMOPLAST Apply 1 Application topically as needed for irritation (perineal discomfort).   CALCIUM 600 IRON/D PO Take 1 tablet by mouth daily.   coconut oil Oil Apply 1 Application topically as needed.   dibucaine 1 % Oint Commonly known as: NUPERCAINAL Place 1 Application rectally as needed for hemorrhoids.   ibuprofen  600 MG tablet Commonly known as: ADVIL  Take 1 tablet (600 mg total) by mouth every 6 (six) hours.   omega-3 acid ethyl esters 1 g capsule Commonly known as: LOVAZA Take by mouth.   prenatal multivitamin Tabs tablet Take 1 tablet by mouth daily at 12 noon.   senna-docusate 8.6-50 MG tablet Commonly known as: Senokot-S Take 2 tablets by mouth daily.   simethicone  80 MG chewable tablet Commonly known as: MYLICON Chew 1 tablet (80 mg total) by mouth as needed for flatulence.   witch hazel-glycerin  pad Commonly known as: TUCKS Apply 1 Application topically as needed for hemorrhoids.       Discharge home in stable condition Infant Feeding: Breast Infant Disposition:home with mother Discharge instruction: per After Visit Summary and Postpartum booklet. Activity: Advance as tolerated. Pelvic rest for 6 weeks.  Diet: routine diet Anticipated Birth Control:  Condoms Postpartum Appointment:6 weeks Additional Postpartum F/U: none Future Appointments:No future appointments. Follow up Visit:  Follow-up Information     Leonce Garnette BIRCH, MD. Schedule an appointment as soon as possible for a visit in 6 week(s).   Specialty: Obstetrics and Gynecology Contact information: [address] [place] [address] [phone]                 Plan:  Candice Hoover was discharged to home in good condition. Follow-up appointment as directed.    Signed: Bobbette Brunswick CNM

## 2024-06-05 NOTE — H&P (Signed)
 OB History & Physical   History of Present Illness:  Chief Complaint:   HPI:  Candice Hoover is a 38 y.o. G46P2012 female at [redacted]w[redacted]d dated by LMP.  She presents to L&D for induction os labor for AMA.  She reports:  -active fetal movement -no leakage of fluid -no vaginal bleeding -no contractions  Pregnancy Issues: 1. AMA: 2. Rh Negative 3. H/O GDM in G1    Maternal Medical History:   Past Medical History:  Diagnosis Date   Rh negative state in antepartum period    Thyroid  disease    Varicella     Past Surgical History:  Procedure Laterality Date   NO PAST SURGERIES      No Known Allergies  Prior to Admission medications   Medication Sig Start Date End Date Taking? Authorizing Provider  Calcium-Vitamin D-Iron (CALCIUM 600 IRON/D PO) Take 1 tablet by mouth daily.    [provider]  omega-3 acid ethyl esters (LOVAZA) 1 g capsule Take by mouth.    [provider]  Prenatal Vit-Fe Fumarate-FA (PRENATAL MULTIVITAMIN) TABS tablet Take 1 tablet by mouth daily at 12 noon.    [provider]     [facility]ren[facility] care [facility]: [facility]   Social History: She  reports that she has never smoked. She has never used smokeless tobacco. She reports that she does not currently use alcohol. She reports that she does not use drugs.  Family History: family history includes Thyroid  disease in her sister.   Review of Systems: A full review of systems was performed and negative except as noted in the HPI.    Physical Exam:  Vital Signs: BP 110/77   Pulse 73   Temp 98.1 F (36.7 C) (Oral)   Resp 16   Ht 5' 3.5 (1.613 m)   Wt 63.5 kg   LMP 08/30/2023   BMI 24.41 kg/m   General:   alert and cooperative  Skin:  normal  Neurologic:    Alert & oriented x 3  Lungs:   Nl effort  Heart:   regular rate and rhythm  Abdomen:  normal findings: soft, non-tender  Extremities: : non-tender, symmetric, no edema bilaterally.     EFW: 05/24/24   3,527   g        70%         7 lb 12 oz   Pertinent Results:  Prenatal Labs: Blood type/Rh B neg  Antibody screen neg  Rubella Immune  Varicella Immune  RPR NR  HBsAg Neg  HIV NR  GC neg  Chlamydia neg  Genetic screening negative  1 hour GTT 120  3 hour GTT   GBS Neg   FHT: FHR: 130 bpm, variability: moderate,  accelerations:  Present,  decelerations:  Absent Category/reactivity:  Category I TOCO: regular, every 2-7 minutes SVE: Dilation: 8.5 / Effacement (%): 90 / Station: -1     Assessment:  Candice Hoover is a 38 y.o. G70P2012 female at [redacted]w[redacted]d with AMA.   Plan:  1. Admit to Labor & Delivery; consents reviewed and obtained  2. Fetal Well being  - Fetal Tracing: Cat I - GBS neg - Presentation: vtx confirmed by sve   3. Routine OB: - Prenatal labs reviewed, as above - Rh neg - CBC & T&S on admit - Clear fluids, IVF  4. Induction of Labor -  Contractions by external toco in place -  Pelvis proven to 3800g -  Plan for induction with cytotec  -  Plan  for continuous fetal monitoring  -  Maternal pain control as desired: IVPM, nitrous, regional anesthesia - Anticipate vaginal delivery  5. Post Partum Planning: - Infant feeding: breastfeeding - Contraception: TBD - Tdap: declined 03/13/24  - Flu: declined 05/02/24  - RSV: declined   Leonidas Boateng, CNM 06/05/2024 5:45 PM

## 2024-06-05 NOTE — Progress Notes (Signed)
 Pt reports that she thinks her water has broken. She describes it as feeling something trickle down her leg. Denies large gush at this time.

## 2024-06-05 NOTE — Clinical Social Work Maternal (Incomplete)
  CLINICAL SOCIAL WORK MATERNAL/CHILD NOTE  Patient Details  Name: Candice Hoover MRN: [id number] Date of Birth: 04-07-1986  Date:  06/05/2024  Clinical Social Worker Initiating Note:    Date/Time: Initiated:   /      Child's Name:      Biological Parents:      Need for Interpreter:      Reason for Referral:      Address:  [address] [place] [address]    Phone number:  [phone] (home)     Additional phone number: ***  Household Members/Support Persons (HM/SP):       HM/SP Name Relationship DOB or Age  HM/SP -1        HM/SP -2        HM/SP -3        HM/SP -4        HM/SP -5        HM/SP -6        HM/SP -7        HM/SP -8          Natural Supports (not living in the home):      Professional Supports:     Employment:     Type of Work:     Education:      Homebound arranged:    Architect:      Other Resources:      Cultural/Religious Considerations Which May Impact Care:  ***  Strengths:      Psychotropic Medications:         Pediatrician:       Pediatrician List:   [facility]      Pediatrician Fax Number:    Risk Factors/Current Problems:      Cognitive State:      Mood/Affect:      CSW Assessment: ***  CSW Plan/Description:       Candice KANDICE Bring, RN 06/05/2024, 2:57 PM

## 2024-06-06 LAB — CBC
HCT: 36.4 % (ref 36.0–46.0)
Hemoglobin: 12.1 g/dL (ref 12.0–15.0)
MCH: 30.4 pg (ref 26.0–34.0)
MCHC: 33.2 g/dL (ref 30.0–36.0)
MCV: 91.5 fL (ref 80.0–100.0)
Platelets: 161 K/uL (ref 150–400)
RBC: 3.98 MIL/uL (ref 3.87–5.11)
RDW: 14.3 % (ref 11.5–15.5)
WBC: 11.4 K/uL — ABNORMAL HIGH (ref 4.0–10.5)
nRBC: 0 % (ref 0.0–0.2)

## 2024-06-06 LAB — RPR: RPR Ser Ql: NONREACTIVE

## 2024-06-06 LAB — FETAL SCREEN: Fetal Screen: NEGATIVE

## 2024-06-06 MED ORDER — COCONUT OIL OIL: 1.0000 | TOPICAL_OIL | Status: AC | PRN

## 2024-06-06 MED ORDER — BENZOCAINE-MENTHOL 20-0.5 % EX AERO: 1.0000 | INHALATION_SPRAY | CUTANEOUS | Status: AC | PRN

## 2024-06-06 MED ORDER — WITCH HAZEL-GLYCERIN EX PADS: 1.0000 | MEDICATED_PAD | CUTANEOUS | Status: AC | PRN

## 2024-06-06 MED ORDER — ACETAMINOPHEN 325 MG PO TABS: 650.0000 mg | ORAL_TABLET | ORAL | Status: AC | PRN

## 2024-06-06 MED ORDER — SIMETHICONE 80 MG PO CHEW: 80.0000 mg | CHEWABLE_TABLET | ORAL | Status: AC | PRN

## 2024-06-06 MED ORDER — IBUPROFEN 600 MG PO TABS: 600.0000 mg | ORAL_TABLET | Freq: Four times a day (QID) | ORAL | 0 refills | Status: AC

## 2024-06-06 MED ORDER — SENNOSIDES-DOCUSATE SODIUM 8.6-50 MG PO TABS: 2.0000 | ORAL_TABLET | ORAL | Status: AC

## 2024-06-06 MED ORDER — FERROUS SULFATE 325 (65 FE) MG PO TABS: 325.0000 mg | ORAL_TABLET | Freq: Two times a day (BID) | ORAL | Status: DC

## 2024-06-06 MED ORDER — RHO D IMMUNE GLOBULIN 1500 UNIT/2ML IJ SOSY
300.0000 ug | PREFILLED_SYRINGE | Freq: Once | INTRAMUSCULAR | Status: AC
  Administered 2024-06-06: 300 ug via INTRAVENOUS
  Filled 2024-06-06: qty 2

## 2024-06-06 MED ORDER — DIBUCAINE (PERIANAL) 1 % EX OINT: 1.0000 | TOPICAL_OINTMENT | CUTANEOUS | Status: AC | PRN

## 2024-06-06 NOTE — Plan of Care (Signed)
 Patient discharged home with family.  Discharge instructions, when to follow up, and prescriptions reviewed with patient.  Patient verbalized understanding. Patient will be escorted out by auxiliary.   Elyn Sharps, RN 06/06/24 @1741 

## 2024-06-06 NOTE — Progress Notes (Signed)
 I concur with student charting.

## 2024-06-06 NOTE — Progress Notes (Signed)
 I concur with student assessment and charting.

## 2024-06-06 NOTE — Discharge Instructions (Signed)

## 2024-06-07 LAB — RHOGAM INJECTION: Unit division: 0
